# Patient Record
Sex: Female | Born: 1955
Health system: Southern US, Community
[De-identification: ages and names within clinical notes are randomized; demographics above are authoritative.]

## PROBLEM LIST (undated history)

## (undated) DIAGNOSIS — F419 Anxiety disorder, unspecified: Secondary | ICD-10-CM

## (undated) DIAGNOSIS — E785 Hyperlipidemia, unspecified: Secondary | ICD-10-CM

## (undated) DIAGNOSIS — R32 Unspecified urinary incontinence: Secondary | ICD-10-CM

## (undated) DIAGNOSIS — E162 Hypoglycemia, unspecified: Secondary | ICD-10-CM

## (undated) DIAGNOSIS — I10 Essential (primary) hypertension: Secondary | ICD-10-CM

## (undated) DIAGNOSIS — F329 Major depressive disorder, single episode, unspecified: Secondary | ICD-10-CM

## (undated) DIAGNOSIS — M109 Gout, unspecified: Secondary | ICD-10-CM

## (undated) DIAGNOSIS — G629 Polyneuropathy, unspecified: Secondary | ICD-10-CM

## (undated) DIAGNOSIS — K219 Gastro-esophageal reflux disease without esophagitis: Secondary | ICD-10-CM

## (undated) DIAGNOSIS — F32A Depression, unspecified: Secondary | ICD-10-CM

## (undated) DIAGNOSIS — E039 Hypothyroidism, unspecified: Secondary | ICD-10-CM

## (undated) DIAGNOSIS — N319 Neuromuscular dysfunction of bladder, unspecified: Secondary | ICD-10-CM

## (undated) HISTORY — DX: Anxiety disorder, unspecified: F41.9

## (undated) HISTORY — DX: Major depressive disorder, single episode, unspecified: F32.9

## (undated) HISTORY — DX: Gastro-esophageal reflux disease without esophagitis: K21.9

## (undated) HISTORY — DX: Gout, unspecified: M10.9

## (undated) HISTORY — PX: MULTIPLE TOOTH EXTRACTIONS: SHX2053

## (undated) HISTORY — DX: Hypothyroidism, unspecified: E03.9

## (undated) HISTORY — DX: Neuromuscular dysfunction of bladder, unspecified: N31.9

## (undated) HISTORY — DX: Depression, unspecified: F32.A

## (undated) HISTORY — DX: Hyperlipidemia, unspecified: E78.5

## (undated) HISTORY — DX: Hypoglycemia, unspecified: E16.2

## (undated) HISTORY — DX: Unspecified urinary incontinence: R32

## (undated) HISTORY — DX: Polyneuropathy, unspecified: G62.9

---

## 2015-05-17 DIAGNOSIS — S92355A Nondisplaced fracture of fifth metatarsal bone, left foot, initial encounter for closed fracture: Secondary | ICD-10-CM | POA: Diagnosis not present

## 2015-05-17 DIAGNOSIS — S93412A Sprain of calcaneofibular ligament of left ankle, initial encounter: Secondary | ICD-10-CM | POA: Diagnosis not present

## 2015-05-17 DIAGNOSIS — S8011XA Contusion of right lower leg, initial encounter: Secondary | ICD-10-CM | POA: Diagnosis not present

## 2015-06-07 DIAGNOSIS — S93412D Sprain of calcaneofibular ligament of left ankle, subsequent encounter: Secondary | ICD-10-CM | POA: Diagnosis not present

## 2015-06-07 DIAGNOSIS — S8011XD Contusion of right lower leg, subsequent encounter: Secondary | ICD-10-CM | POA: Diagnosis not present

## 2015-06-29 DIAGNOSIS — S92355A Nondisplaced fracture of fifth metatarsal bone, left foot, initial encounter for closed fracture: Secondary | ICD-10-CM | POA: Diagnosis not present

## 2015-06-29 DIAGNOSIS — M62579 Muscle wasting and atrophy, not elsewhere classified, unspecified ankle and foot: Secondary | ICD-10-CM | POA: Diagnosis not present

## 2015-06-29 DIAGNOSIS — M25672 Stiffness of left ankle, not elsewhere classified: Secondary | ICD-10-CM | POA: Diagnosis not present

## 2015-06-29 DIAGNOSIS — S93412A Sprain of calcaneofibular ligament of left ankle, initial encounter: Secondary | ICD-10-CM | POA: Diagnosis not present

## 2015-07-06 DIAGNOSIS — M62579 Muscle wasting and atrophy, not elsewhere classified, unspecified ankle and foot: Secondary | ICD-10-CM | POA: Diagnosis not present

## 2015-07-06 DIAGNOSIS — Z13820 Encounter for screening for osteoporosis: Secondary | ICD-10-CM | POA: Diagnosis not present

## 2015-07-06 DIAGNOSIS — Z0000 Encounter for general adult medical examination without abnormal findings: Secondary | ICD-10-CM | POA: Diagnosis not present

## 2015-07-06 DIAGNOSIS — M25672 Stiffness of left ankle, not elsewhere classified: Secondary | ICD-10-CM | POA: Diagnosis not present

## 2015-07-06 DIAGNOSIS — Z1211 Encounter for screening for malignant neoplasm of colon: Secondary | ICD-10-CM | POA: Diagnosis not present

## 2015-07-06 DIAGNOSIS — S93412D Sprain of calcaneofibular ligament of left ankle, subsequent encounter: Secondary | ICD-10-CM | POA: Diagnosis not present

## 2015-07-06 DIAGNOSIS — F329 Major depressive disorder, single episode, unspecified: Secondary | ICD-10-CM | POA: Diagnosis not present

## 2015-07-06 DIAGNOSIS — Z1231 Encounter for screening mammogram for malignant neoplasm of breast: Secondary | ICD-10-CM | POA: Diagnosis not present

## 2015-07-06 DIAGNOSIS — S92355D Nondisplaced fracture of fifth metatarsal bone, left foot, subsequent encounter for fracture with routine healing: Secondary | ICD-10-CM | POA: Diagnosis not present

## 2015-07-06 DIAGNOSIS — R42 Dizziness and giddiness: Secondary | ICD-10-CM | POA: Diagnosis not present

## 2015-07-13 DIAGNOSIS — S93412D Sprain of calcaneofibular ligament of left ankle, subsequent encounter: Secondary | ICD-10-CM | POA: Diagnosis not present

## 2015-07-13 DIAGNOSIS — S92355D Nondisplaced fracture of fifth metatarsal bone, left foot, subsequent encounter for fracture with routine healing: Secondary | ICD-10-CM | POA: Diagnosis not present

## 2015-07-13 DIAGNOSIS — M25672 Stiffness of left ankle, not elsewhere classified: Secondary | ICD-10-CM | POA: Diagnosis not present

## 2015-07-13 DIAGNOSIS — M62579 Muscle wasting and atrophy, not elsewhere classified, unspecified ankle and foot: Secondary | ICD-10-CM | POA: Diagnosis not present

## 2015-07-19 DIAGNOSIS — S93412D Sprain of calcaneofibular ligament of left ankle, subsequent encounter: Secondary | ICD-10-CM | POA: Diagnosis not present

## 2015-07-19 DIAGNOSIS — S8011XD Contusion of right lower leg, subsequent encounter: Secondary | ICD-10-CM | POA: Diagnosis not present

## 2015-07-19 DIAGNOSIS — S92355D Nondisplaced fracture of fifth metatarsal bone, left foot, subsequent encounter for fracture with routine healing: Secondary | ICD-10-CM | POA: Diagnosis not present

## 2015-10-12 DIAGNOSIS — E039 Hypothyroidism, unspecified: Secondary | ICD-10-CM | POA: Diagnosis not present

## 2015-10-12 DIAGNOSIS — L259 Unspecified contact dermatitis, unspecified cause: Secondary | ICD-10-CM | POA: Diagnosis not present

## 2015-10-12 DIAGNOSIS — F329 Major depressive disorder, single episode, unspecified: Secondary | ICD-10-CM | POA: Diagnosis not present

## 2015-10-12 DIAGNOSIS — E538 Deficiency of other specified B group vitamins: Secondary | ICD-10-CM | POA: Diagnosis not present

## 2015-10-12 DIAGNOSIS — M109 Gout, unspecified: Secondary | ICD-10-CM | POA: Diagnosis not present

## 2015-10-12 DIAGNOSIS — E785 Hyperlipidemia, unspecified: Secondary | ICD-10-CM | POA: Diagnosis not present

## 2015-10-25 DIAGNOSIS — E538 Deficiency of other specified B group vitamins: Secondary | ICD-10-CM | POA: Diagnosis not present

## 2015-10-25 DIAGNOSIS — E039 Hypothyroidism, unspecified: Secondary | ICD-10-CM | POA: Diagnosis not present

## 2015-11-02 DIAGNOSIS — W19XXXA Unspecified fall, initial encounter: Secondary | ICD-10-CM | POA: Diagnosis not present

## 2015-11-02 DIAGNOSIS — R209 Unspecified disturbances of skin sensation: Secondary | ICD-10-CM | POA: Diagnosis not present

## 2015-11-02 DIAGNOSIS — R42 Dizziness and giddiness: Secondary | ICD-10-CM | POA: Diagnosis not present

## 2015-11-02 DIAGNOSIS — E538 Deficiency of other specified B group vitamins: Secondary | ICD-10-CM | POA: Diagnosis not present

## 2015-11-02 DIAGNOSIS — R2681 Unsteadiness on feet: Secondary | ICD-10-CM | POA: Diagnosis not present

## 2015-11-02 DIAGNOSIS — F418 Other specified anxiety disorders: Secondary | ICD-10-CM | POA: Diagnosis not present

## 2015-11-02 DIAGNOSIS — F419 Anxiety disorder, unspecified: Secondary | ICD-10-CM | POA: Diagnosis not present

## 2015-12-07 DIAGNOSIS — E539 Vitamin B deficiency, unspecified: Secondary | ICD-10-CM | POA: Diagnosis not present

## 2015-12-07 DIAGNOSIS — E039 Hypothyroidism, unspecified: Secondary | ICD-10-CM | POA: Diagnosis not present

## 2016-04-08 DIAGNOSIS — Z23 Encounter for immunization: Secondary | ICD-10-CM | POA: Diagnosis not present

## 2016-04-17 DIAGNOSIS — M5431 Sciatica, right side: Secondary | ICD-10-CM | POA: Diagnosis not present

## 2016-04-17 DIAGNOSIS — E039 Hypothyroidism, unspecified: Secondary | ICD-10-CM | POA: Diagnosis not present

## 2016-04-17 DIAGNOSIS — E539 Vitamin B deficiency, unspecified: Secondary | ICD-10-CM | POA: Diagnosis not present

## 2016-04-17 DIAGNOSIS — Z1231 Encounter for screening mammogram for malignant neoplasm of breast: Secondary | ICD-10-CM | POA: Diagnosis not present

## 2016-04-17 DIAGNOSIS — Z13820 Encounter for screening for osteoporosis: Secondary | ICD-10-CM | POA: Diagnosis not present

## 2016-04-25 DIAGNOSIS — Z1231 Encounter for screening mammogram for malignant neoplasm of breast: Secondary | ICD-10-CM | POA: Diagnosis not present

## 2016-04-25 DIAGNOSIS — Z01419 Encounter for gynecological examination (general) (routine) without abnormal findings: Secondary | ICD-10-CM | POA: Diagnosis not present

## 2016-04-25 DIAGNOSIS — Z124 Encounter for screening for malignant neoplasm of cervix: Secondary | ICD-10-CM | POA: Diagnosis not present

## 2016-07-17 ENCOUNTER — Encounter: Payer: Self-pay | Admitting: Neurology

## 2016-08-16 ENCOUNTER — Ambulatory Visit: Payer: Self-pay | Admitting: Neurology

## 2016-09-19 ENCOUNTER — Encounter: Payer: Self-pay | Admitting: Neurology

## 2016-09-19 ENCOUNTER — Ambulatory Visit (INDEPENDENT_AMBULATORY_CARE_PROVIDER_SITE_OTHER): Payer: Medicare Other | Admitting: Neurology

## 2016-09-19 VITALS — BP 126/78 | HR 80 | Ht 61.0 in | Wt 210.0 lb

## 2016-09-19 DIAGNOSIS — N319 Neuromuscular dysfunction of bladder, unspecified: Secondary | ICD-10-CM

## 2016-09-19 DIAGNOSIS — F339 Major depressive disorder, recurrent, unspecified: Secondary | ICD-10-CM | POA: Insufficient documentation

## 2016-09-19 DIAGNOSIS — G609 Hereditary and idiopathic neuropathy, unspecified: Secondary | ICD-10-CM | POA: Diagnosis not present

## 2016-09-19 DIAGNOSIS — F329 Major depressive disorder, single episode, unspecified: Secondary | ICD-10-CM

## 2016-09-19 DIAGNOSIS — F32A Depression, unspecified: Secondary | ICD-10-CM

## 2016-09-19 DIAGNOSIS — F419 Anxiety disorder, unspecified: Secondary | ICD-10-CM | POA: Insufficient documentation

## 2016-09-19 MED ORDER — LYRICA 225 MG PO CAPS: 225.0000 mg | ORAL_CAPSULE | Freq: Two times a day (BID) | ORAL | 3 refills | Status: DC

## 2016-09-19 MED ORDER — POTASSIUM CHLORIDE ER 10 MEQ PO TBCR: EXTENDED_RELEASE_TABLET | ORAL | 0 refills | Status: DC

## 2016-09-19 MED ORDER — SERTRALINE HCL 100 MG PO TABS: 100.0000 mg | ORAL_TABLET | Freq: Every day | ORAL | 0 refills | Status: DC

## 2016-09-19 MED ORDER — TOLTERODINE TARTRATE 2 MG PO TABS: 2.0000 mg | ORAL_TABLET | Freq: Two times a day (BID) | ORAL | 0 refills | Status: DC

## 2016-09-19 NOTE — Progress Notes (Signed)
NEUROLOGY CONSULTATION NOTE  Samantha Burch MRN: [id number] DOB: 1956/04/16  Referring provider: Kathlynn Grate, PA Primary care provider: none listed  Reason for consult:  Peripheral polyneuropathy  Thank you for your kind referral of Samantha Burch for consultation of the above symptoms. Although her history is well known to you, please allow me to reiterate it for the purpose of our medical record. Records and images were personally reviewed where available.  HISTORY OF PRESENT ILLNESS: This is a very pleasant 61 year old right-handed woman with a history of hypothyroidism, neurogenic bladder, depression, anxiety, presenting to establish care for peripheral polyneuropathy. She recently moved to [place] from [place] with her son. She had been seeing neurologist Dr. [name] for the neuropathy. 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 Symptoms worsened over time with significant pain in both legs described as pain inside her legs like she wanted to scratch them all the time. She described pins and needles sensation, sometimes affecting her arms as well. She was initially started on gabapentin but it stopped working, and has been taking Lyrica for the past 10 years. She is taking Lyrica 225mg  BID without side effects. She reports symptom control is much better but not perfect, there are days the neuropathy bothers her, more when she is inactive. She has found that moving around helps relieve the symptoms. She fell last year and broke her left foot, and since then has tended to veer when she gets up. She denies any back pain, but reports pain in the right hip region radiating down the back of her right leg. She tells me her neurologist told her she has unusually brisk reflexes for the neuropathy, per records she has had an MRI brain, cervical, thoracic spine that were unremarkable. NCS reported as normal. Her  B12 level was low and she has been on monthly B12 injections. She has a history of vertigo that significantly improved with vestibular therapy, she was told she has gaze instability, and has intermittent vertigo with positional changes. She denies any headaches, tinnitus, diplopia, dysarthria/dysphagia, neck pain, bowel/bladder dysfunction. She has a history of neurogenic bladder on Detrol, low potassium on daily potassium supplements, and depression/anxiety on Zoloft. She reports a family history of peripheral polyneuropathy in her father and paternal grandfather.  PAST MEDICAL HISTORY:  Active Ambulatory Problems    Diagnosis Date Noted  . No Active Ambulatory Problems   Resolved Ambulatory Problems    Diagnosis Date Noted  . No Resolved Ambulatory Problems   Past Medical History:  Diagnosis Date  . Anxiety   . Depression   . Hypothyroidism   . Neurogenic bladder   . Neuropathy    PAST SURGICAL HISTORY: No past surgical history on file.  MEDICATIONS:  Outpatient Encounter Prescriptions as of 09/19/2016  Medication Sig  . busPIRone (BUSPAR) 15 MG tablet Take 15 mg by mouth 2 (two) times daily.  . cyanocobalamin (,VITAMIN B-12,) 1000 MCG/ML injection INJECT 1 ML SUBCUTANEOUSLY ONCE WEEKLY FOR 4 WEEKS THEN ONCE MONTHLY  . levothyroxine (SYNTHROID, LEVOTHROID) 88 MCG tablet Take 88 mcg by mouth daily.  Marland Kitchen LYRICA 225 MG capsule Take 225 mg by mouth 2 (two) times daily.  Marland Kitchen omeprazole (PRILOSEC) 20 MG capsule Take 20 mg by mouth 2 (two) times daily before a meal.  . sertraline (ZOLOFT) 100 MG tablet Take 100 mg by mouth daily. Take 1.5 tabs daily  . tolterodine (DETROL)  2 MG tablet Take 2 mg by mouth 2 (two) times daily.   No facility-administered encounter medications on file as of 09/19/2016.    Outpatient Encounter Prescriptions as of 09/19/2016  Medication Sig  . busPIRone (BUSPAR) 15 MG tablet Take 15 mg by mouth 2 (two) times daily.  . cyanocobalamin (,VITAMIN B-12,) 1000 MCG/ML  injection INJECT 1 ML SUBCUTANEOUSLY ONCE WEEKLY FOR 4 WEEKS THEN ONCE MONTHLY  . levothyroxine (SYNTHROID, LEVOTHROID) 88 MCG tablet Take 88 mcg by mouth daily.  Marland Kitchen LYRICA 225 MG capsule Take 1 capsule (225 mg total) by mouth 2 (two) times daily.  Marland Kitchen omeprazole (PRILOSEC) 20 MG capsule Take 20 mg by mouth 2 (two) times daily before a meal.  . sertraline (ZOLOFT) 100 MG tablet Take 1 tablet (100 mg total) by mouth daily. Take 1.5 tabs daily  . tolterodine (DETROL) 2 MG tablet Take 1 tablet (2 mg total) by mouth 2 (two) times daily.  . [DISCONTINUED] LYRICA 225 MG capsule Take 225 mg by mouth 2 (two) times daily.  . [DISCONTINUED] sertraline (ZOLOFT) 100 MG tablet Take 100 mg by mouth daily. Take 1.5 tabs daily  . [DISCONTINUED] tolterodine (DETROL) 2 MG tablet Take 2 mg by mouth 2 (two) times daily.  . potassium chloride (K-DUR) 10 MEQ tablet Take 1 tablet daily   No facility-administered encounter medications on file as of 09/19/2016.    ALLERGIES: No Known Allergies  FAMILY HISTORY: No family history on file.  SOCIAL HISTORY: Social History   Social History  . Marital status: Unknown    Spouse name: N/A  . Number of children: N/A  . Years of education: N/A   Occupational History  . Not on file.   Social History Main Topics  . Smoking status: Former Research scientist (life sciences)  . Smokeless tobacco: Never Used  . Alcohol use No  . Drug use: No  . Sexual activity: Not on file   Other Topics Concern  . Not on file   Social History Narrative  . No narrative on file    REVIEW OF SYSTEMS: Constitutional: No fevers, chills, or sweats, no generalized fatigue, change in appetite Eyes: No visual changes, double vision, eye pain Ear, nose and throat: No hearing loss, ear pain, nasal congestion, sore throat Cardiovascular: No chest pain, palpitations Respiratory:  No shortness of breath at rest or with exertion, wheezes GastrointestinaI: No nausea, vomiting, diarrhea, abdominal pain, fecal  incontinence Genitourinary:  No dysuria, urinary retention or frequency Musculoskeletal:  No neck pain, back pain Integumentary: No rash, pruritus, skin lesions Neurological: as above Psychiatric: + depression, insomnia, anxiety Endocrine: No palpitations, fatigue, diaphoresis, mood swings, change in appetite, change in weight, increased thirst Hematologic/Lymphatic:  No anemia, purpura, petechiae. Allergic/Immunologic: no itchy/runny eyes, nasal congestion, recent allergic reactions, rashes  PHYSICAL EXAM: Vitals:   09/19/16 1410  BP: 126/78  Pulse: 80   General: No acute distress Head:  Normocephalic/atraumatic Eyes: Fundoscopic exam shows bilateral sharp discs, no vessel changes, exudates, or hemorrhages Neck: supple, no paraspinal tenderness, full range of motion Back: No paraspinal tenderness Heart: regular rate and rhythm Lungs: Clear to auscultation bilaterally. Vascular: No carotid bruits. Skin/Extremities: No rash, no edema Neurological Exam: Mental status: alert and oriented to person, place, and time, no dysarthria or aphasia, Fund of knowledge is appropriate.  Recent and remote memory are intact.  Attention and concentration are normal.    Able to name objects and repeat phrases. Cranial nerves: CN I: not tested CN II: pupils equal, round and reactive to  light, visual fields intact, fundi unremarkable. CN III, IV, VI:  full range of motion, no nystagmus, no ptosis CN V: facial sensation intact CN VII: upper and lower face symmetric CN VIII: hearing intact to finger rub CN IX, X: gag intact, uvula midline CN XI: sternocleidomastoid and trapezius muscles intact CN XII: tongue midline Bulk & Tone: normal, no fasciculations. Motor: 5/5 throughout with no pronator drift. Sensation: reports increased cold on both hands, decreased cold on both feet, decreased vibration to right ankle, intact pin on both UE and LE, intact joint position sense.  No extinction to double  simultaneous stimulation.  Romberg test negative Deep Tendon Reflexes: brisk +3 throughout, with +bilateral pectoralis reflex and Hoffman sign, no ankle clonus Plantar responses: downgoing bilaterally Cerebellar: no incoordination on finger to nose, heel to shin. No dysdiadochokinesia Gait: narrow-based and steady, mild difficulty with tandem walk but able Tremor: none  IMPRESSION: This is a very pleasant 61 year old right-handed woman with a history of hypothyroidism, neurogenic bladder, depression, anxiety, presenting to establish care for peripheral polyneuropathy. Per notes, she has an unremarkable MRI brain, cervical, and thoracic spine, and normal nerve conduction studies. Reports unavailable for review and will be requested. Her exam shows mild length-dependent neuropathy with abnormally brisk reflexes, including +pectoralis reflex and Hoffman sign bilaterally. We discussed continuation of current dose of Lyrica 225mg  BID, which she agrees with. She has not yet established care with a local PCP and will be given a 1 month supply of Zoloft, potassium, and Detrol until she establishes PCP care. She will follow-up in 1 year and knows to call for any changes.   Thank you for allowing me to participate in the care of this patient. Please do not hesitate to call for any questions or concerns.   Ellouise Newer, M.D.  CC: Kathlynn Grate, [place]

## 2016-09-19 NOTE — Patient Instructions (Signed)
1. Continue all your medications as prescribed 2. Let us know about the B12 injections 3. Follow-up in 1 year, call for any changes

## 2016-09-20 ENCOUNTER — Ambulatory Visit: Payer: Self-pay | Admitting: Neurology

## 2016-10-20 ENCOUNTER — Other Ambulatory Visit: Payer: Self-pay | Admitting: Neurology

## 2016-10-20 DIAGNOSIS — N319 Neuromuscular dysfunction of bladder, unspecified: Secondary | ICD-10-CM

## 2016-10-20 DIAGNOSIS — F329 Major depressive disorder, single episode, unspecified: Secondary | ICD-10-CM

## 2016-10-20 DIAGNOSIS — F32A Depression, unspecified: Secondary | ICD-10-CM

## 2016-10-20 DIAGNOSIS — F419 Anxiety disorder, unspecified: Secondary | ICD-10-CM

## 2016-10-20 DIAGNOSIS — G609 Hereditary and idiopathic neuropathy, unspecified: Secondary | ICD-10-CM

## 2016-11-10 ENCOUNTER — Other Ambulatory Visit: Payer: Self-pay | Admitting: Neurology

## 2016-11-10 DIAGNOSIS — F419 Anxiety disorder, unspecified: Secondary | ICD-10-CM

## 2016-11-10 DIAGNOSIS — G609 Hereditary and idiopathic neuropathy, unspecified: Secondary | ICD-10-CM

## 2016-11-10 DIAGNOSIS — F329 Major depressive disorder, single episode, unspecified: Secondary | ICD-10-CM

## 2016-11-10 DIAGNOSIS — F32A Depression, unspecified: Secondary | ICD-10-CM

## 2016-12-11 ENCOUNTER — Other Ambulatory Visit: Payer: Self-pay | Admitting: Neurology

## 2016-12-11 DIAGNOSIS — F419 Anxiety disorder, unspecified: Secondary | ICD-10-CM

## 2016-12-11 DIAGNOSIS — F329 Major depressive disorder, single episode, unspecified: Secondary | ICD-10-CM

## 2016-12-11 DIAGNOSIS — F32A Depression, unspecified: Secondary | ICD-10-CM

## 2016-12-11 DIAGNOSIS — G609 Hereditary and idiopathic neuropathy, unspecified: Secondary | ICD-10-CM

## 2017-01-13 ENCOUNTER — Encounter: Payer: Self-pay | Admitting: Family Medicine

## 2017-01-13 ENCOUNTER — Ambulatory Visit (INDEPENDENT_AMBULATORY_CARE_PROVIDER_SITE_OTHER): Payer: Medicare Other | Admitting: Family Medicine

## 2017-01-13 VITALS — BP 130/86 | HR 77 | Temp 98.1°F | Ht 60.5 in | Wt 197.3 lb

## 2017-01-13 DIAGNOSIS — E039 Hypothyroidism, unspecified: Secondary | ICD-10-CM | POA: Diagnosis not present

## 2017-01-13 DIAGNOSIS — Z7689 Persons encountering health services in other specified circumstances: Secondary | ICD-10-CM | POA: Diagnosis not present

## 2017-01-13 DIAGNOSIS — E538 Deficiency of other specified B group vitamins: Secondary | ICD-10-CM

## 2017-01-13 DIAGNOSIS — Z8639 Personal history of other endocrine, nutritional and metabolic disease: Secondary | ICD-10-CM

## 2017-01-13 DIAGNOSIS — F339 Major depressive disorder, recurrent, unspecified: Secondary | ICD-10-CM | POA: Diagnosis not present

## 2017-01-13 DIAGNOSIS — F329 Major depressive disorder, single episode, unspecified: Secondary | ICD-10-CM

## 2017-01-13 DIAGNOSIS — R42 Dizziness and giddiness: Secondary | ICD-10-CM | POA: Diagnosis not present

## 2017-01-13 DIAGNOSIS — K219 Gastro-esophageal reflux disease without esophagitis: Secondary | ICD-10-CM | POA: Diagnosis not present

## 2017-01-13 DIAGNOSIS — Z23 Encounter for immunization: Secondary | ICD-10-CM | POA: Diagnosis not present

## 2017-01-13 DIAGNOSIS — J3089 Other allergic rhinitis: Secondary | ICD-10-CM

## 2017-01-13 DIAGNOSIS — G609 Hereditary and idiopathic neuropathy, unspecified: Secondary | ICD-10-CM | POA: Diagnosis not present

## 2017-01-13 DIAGNOSIS — N319 Neuromuscular dysfunction of bladder, unspecified: Secondary | ICD-10-CM | POA: Diagnosis not present

## 2017-01-13 DIAGNOSIS — F419 Anxiety disorder, unspecified: Secondary | ICD-10-CM

## 2017-01-13 LAB — CBC WITH DIFFERENTIAL/PLATELET
BASOS ABS: 0 10*3/uL (ref 0.0–0.1)
Basophils Relative: 0.6 % (ref 0.0–3.0)
EOS ABS: 0.1 10*3/uL (ref 0.0–0.7)
Eosinophils Relative: 1.7 % (ref 0.0–5.0)
HEMATOCRIT: 41.4 % (ref 36.0–46.0)
Hemoglobin: 13.9 g/dL (ref 12.0–15.0)
LYMPHS ABS: 3.9 10*3/uL (ref 0.7–4.0)
LYMPHS PCT: 48.1 % — AB (ref 12.0–46.0)
MCHC: 33.6 g/dL (ref 30.0–36.0)
MCV: 86.8 fl (ref 78.0–100.0)
Monocytes Absolute: 0.7 10*3/uL (ref 0.1–1.0)
Monocytes Relative: 8.9 % (ref 3.0–12.0)
NEUTROS ABS: 3.3 10*3/uL (ref 1.4–7.7)
NEUTROS PCT: 40.7 % — AB (ref 43.0–77.0)
PLATELETS: 231 10*3/uL (ref 150.0–400.0)
RBC: 4.77 Mil/uL (ref 3.87–5.11)
RDW: 13.4 % (ref 11.5–15.5)
WBC: 8.1 10*3/uL (ref 4.0–10.5)

## 2017-01-13 LAB — COMPREHENSIVE METABOLIC PANEL
ALBUMIN: 4.3 g/dL (ref 3.5–5.2)
ALK PHOS: 86 U/L (ref 39–117)
ALT: 27 U/L (ref 0–35)
AST: 28 U/L (ref 0–37)
BUN: 9 mg/dL (ref 6–23)
CALCIUM: 9.7 mg/dL (ref 8.4–10.5)
CHLORIDE: 100 meq/L (ref 96–112)
CO2: 29 mEq/L (ref 19–32)
CREATININE: 0.85 mg/dL (ref 0.40–1.20)
GFR: 72.37 mL/min (ref >60.00)
Glucose, Bld: 87 mg/dL (ref 70–99)
POTASSIUM: 3.5 meq/L (ref 3.5–5.1)
Sodium: 141 mEq/L (ref 135–145)
Total Bilirubin: 0.5 mg/dL (ref 0.2–1.2)
Total Protein: 6.9 g/dL (ref 6.0–8.3)

## 2017-01-13 LAB — FOLATE: FOLATE: 15.3 ng/mL (ref >5.9)

## 2017-01-13 LAB — TSH: TSH: 2.6 u[IU]/mL (ref 0.35–4.50)

## 2017-01-13 MED ORDER — LEVOTHYROXINE SODIUM 88 MCG PO TABS: 88.0000 ug | ORAL_TABLET | Freq: Every day | ORAL | 1 refills | Status: DC

## 2017-01-13 MED ORDER — OMEPRAZOLE 20 MG PO CPDR: 20.0000 mg | DELAYED_RELEASE_CAPSULE | Freq: Two times a day (BID) | ORAL | 0 refills | Status: DC

## 2017-01-13 MED ORDER — TOLTERODINE TARTRATE 2 MG PO TABS: 2.0000 mg | ORAL_TABLET | Freq: Two times a day (BID) | ORAL | 0 refills | Status: DC

## 2017-01-13 MED ORDER — FEXOFENADINE HCL 180 MG PO TABS: 180.0000 mg | ORAL_TABLET | Freq: Every day | ORAL | 1 refills | Status: DC

## 2017-01-13 MED ORDER — SERTRALINE HCL 100 MG PO TABS: 150.0000 mg | ORAL_TABLET | Freq: Every day | ORAL | 1 refills | Status: DC

## 2017-01-13 MED ORDER — POTASSIUM CHLORIDE ER 10 MEQ PO CPCR: 10.0000 meq | ORAL_CAPSULE | Freq: Every day | ORAL | 1 refills | Status: DC

## 2017-01-13 MED ORDER — BUSPIRONE HCL 15 MG PO TABS: 15.0000 mg | ORAL_TABLET | Freq: Two times a day (BID) | ORAL | 1 refills | Status: DC
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
## 2017-01-13 NOTE — Patient Instructions (Addendum)
We have ordered labs at this visit. It can take up to 1-2 weeks for results and processing. If results require follow up or explanation, we will call you with instructions. Clinically stable results will be released to your [facility] or sent to you via mail. If you have not heard from [place] or cannot find your results in [facility] in 2 weeks please contact our office at [phone].   At today's visit you signed a records release form so that we can get your records from your previous physician.   You have several medical problems in which you would benefit from seeing a physician who specializes in that particular area.  I know that cost is an issue but seeing a Gastroenterologist for your continued acid reflux symptoms would probably be a good idea. Generalized Anxiety Disorder, Adult Generalized anxiety disorder (GAD) is a mental health disorder. People with this condition constantly worry about everyday events. Unlike normal anxiety, worry related to GAD is not triggered by a specific event. These worries also do not fade or get better with time. GAD interferes with life functions, including relationships, work, and school. GAD can vary from mild to severe. People with severe GAD can have intense waves of anxiety with physical symptoms (panic attacks). What are the causes? The exact cause of GAD is not known. What increases the risk? This condition is more likely to develop in:  Women.  People who have a family history of anxiety disorders.  People who are very shy.  People who experience very stressful life events, such as the death of a loved one.  People who have a very stressful family environment.  What are the signs or symptoms? People with GAD often worry excessively about many things in their lives, such as their health and family. They may also be overly concerned about:  Doing well at work.  Being on time.  Natural disasters.  Friendships.  Physical symptoms of GAD  include:  Fatigue.  Muscle tension or having muscle twitches.  Trembling or feeling shaky.  Being easily startled.  Feeling like your heart is pounding or racing.  Feeling out of breath or like you cannot take a deep breath.  Having trouble falling asleep or staying asleep.  Sweating.  Nausea, diarrhea, or irritable bowel syndrome (IBS).  Headaches.  Trouble concentrating or remembering facts.  Restlessness.  Irritability.  How is this diagnosed? Your health care provider can diagnose GAD based on your symptoms and medical history. You will also have a physical exam. The health care provider will ask specific questions about your symptoms, including how severe they are, when they started, and if they come and go. Your health care provider may ask you about your use of alcohol or drugs, including prescription medicines. Your health care provider may refer you to a mental health specialist for further evaluation. Your health care provider will do a thorough examination and may perform additional tests to rule out other possible causes of your symptoms. To be diagnosed with GAD, a person must have anxiety that:  Is out of his or her control.  Affects several different aspects of his or her life, such as work and relationships.  Causes distress that makes him or her unable to take part in normal activities.  Includes at least three physical symptoms of GAD, such as restlessness, fatigue, trouble concentrating, irritability, muscle tension, or sleep problems.  Before your health care provider can confirm a diagnosis of GAD, these symptoms must be present more  days than they are not, and they must last for six months or longer. How is this treated? The following therapies are usually used to treat GAD:  Medicine. Antidepressant medicine is usually prescribed for long-term daily control. Antianxiety medicines may be added in severe cases, especially when panic attacks  occur.  Talk therapy (psychotherapy). Certain types of talk therapy can be helpful in treating GAD by providing support, education, and guidance. Options include: ? Cognitive behavioral therapy (CBT). People learn coping skills and techniques to ease their anxiety. They learn to identify unrealistic or negative thoughts and behaviors and to replace them with positive ones. ? Acceptance and commitment therapy (ACT). This treatment teaches people how to be mindful as a way to cope with unwanted thoughts and feelings. ? Biofeedback. This process trains you to manage your body's response (physiological response) through breathing techniques and relaxation methods. You will work with a therapist while machines are used to monitor your physical symptoms.  Stress management techniques. These include yoga, meditation, and exercise.  A mental health specialist can help determine which treatment is best for you. Some people see improvement with one type of therapy. However, other people require a combination of therapies. Follow these instructions at home:  Take over-the-counter and prescription medicines only as told by your health care provider.  Try to maintain a normal routine.  Try to anticipate stressful situations and allow extra time to manage them.  Practice any stress management or self-calming techniques as taught by your health care provider.  Do not punish yourself for setbacks or for not making progress.  Try to recognize your accomplishments, even if they are small.  Keep all follow-up visits as told by your health care provider. This is important. Contact a health care provider if:  Your symptoms do not get better.  Your symptoms get worse.  You have signs of depression, such as: ? A persistently sad, cranky, or irritable mood. ? Loss of enjoyment in activities that used to bring you joy. ? Change in weight or eating. ? Changes in sleeping habits. ? Avoiding friends or  family members. ? Loss of energy for normal tasks. ? Feelings of guilt or worthlessness. Get help right away if:  You have serious thoughts about hurting yourself or others. If you ever feel like you may hurt yourself or others, or have thoughts about taking your own life, get help right away. You can go to your nearest emergency department or call:  Your local emergency services (911 in the [place]  A suicide crisis helpline, such as the [facility] at (856)613-7481. This is open 24 hours a day.  Summary  Generalized anxiety disorder (GAD) is a mental health disorder that involves worry that is not triggered by a specific event.  People with GAD often worry excessively about many things in their lives, such as their health and family.  GAD may cause physical symptoms such as restlessness, trouble concentrating, sleep problems, frequent sweating, nausea, diarrhea, headaches, and trembling or muscle twitching.  A mental health specialist can help determine which treatment is best for you. Some people see improvement with one type of therapy. However, other people require a combination of therapies. This information is not intended to replace advice given to you by your health care provider. Make sure you discuss any questions you have with your health care provider. Document Released: 08/17/2012 Document Revised: 03/12/2016 Document Reviewed: 03/12/2016 Elsevier Interactive Patient Education  2018 [place] After  being diagnosed with an anxiety disorder, you may be relieved to know why you have felt or behaved a certain way. It is natural to also feel overwhelmed about the treatment ahead and what it will mean for your life. With care and support, you can manage this condition and recover from it. How to cope with anxiety Dealing with stress Stress is your body's reaction to life changes and events, both good and bad. Stress can last just  a few hours or it can be ongoing. Stress can play a major role in anxiety, so it is important to learn both how to cope with stress and how to think about it differently. Talk with your health care provider or a counselor to learn more about stress reduction. He or she may suggest some stress reduction techniques, such as:  Music therapy. This can include creating or listening to music that you enjoy and that inspires you.  Mindfulness-based meditation. This involves being aware of your normal breaths, rather than trying to control your breathing. It can be done while sitting or walking.  Centering prayer. This is a kind of meditation that involves focusing on a word, phrase, or sacred image that is meaningful to you and that brings you peace.  Deep breathing. To do this, expand your stomach and inhale slowly through your nose. Hold your breath for 3-5 seconds. Then exhale slowly, allowing your stomach muscles to relax.  Self-talk. This is a skill where you identify thought patterns that lead to anxiety reactions and correct those thoughts.  Muscle relaxation. This involves tensing muscles then relaxing them.  Choose a stress reduction technique that fits your lifestyle and personality. Stress reduction techniques take time and practice. Set aside 5-15 minutes a day to do them. Therapists can offer training in these techniques. The training may be covered by some insurance plans. Other things you can do to manage stress include:  Keeping a stress diary. This can help you learn what triggers your stress and ways to control your response.  Thinking about how you respond to certain situations. You may not be able to control everything, but you can control your reaction.  Making time for activities that help you relax, and not feeling guilty about spending your time in this way.  Therapy combined with coping and stress-reduction skills provides the best chance for successful  treatment. Medicines Medicines can help ease symptoms. Medicines for anxiety include:  Anti-anxiety drugs.  Antidepressants.  Beta-blockers.  Medicines may be used as the main treatment for anxiety disorder, along with therapy, or if other treatments are not working. Medicines should be prescribed by a health care provider. Relationships Relationships can play a big part in helping you recover. Try to spend more time connecting with trusted friends and family members. Consider going to couples counseling, taking family education classes, or going to family therapy. Therapy can help you and others better understand the condition. How to recognize changes in your condition Everyone has a different response to treatment for anxiety. Recovery from anxiety happens when symptoms decrease and stop interfering with your daily activities at home or work. This may mean that you will start to:  Have better concentration and focus.  Sleep better.  Be less irritable.  Have more energy.  Have improved memory.  It is important to recognize when your condition is getting worse. Contact your health care provider if your symptoms interfere with home or work and you do not feel like your condition is improving. Where  to find help and support: You can get help and support from these sources:  Self-help groups.  Online and [employer].  A trusted spiritual leader.  Couples counseling.  Family education classes.  Family therapy.  Follow these instructions at home:  Eat a healthy diet that includes plenty of vegetables, fruits, whole grains, low-fat dairy products, and lean protein. Do not eat a lot of foods that are high in solid fats, added sugars, or salt.  Exercise. Most adults should do the following: ? Exercise for at least 150 minutes each week. The exercise should increase your heart rate and make you sweat (moderate-intensity exercise). ? Strengthening exercises at least  twice a week.  Cut down on caffeine, tobacco, alcohol, and other potentially harmful substances.  Get the right amount and quality of sleep. Most adults need 7-9 hours of sleep each night.  Make choices that simplify your life.  Take over-the-counter and prescription medicines only as told by your health care provider.  Avoid caffeine, alcohol, and certain over-the-counter cold medicines. These may make you feel worse. Ask your pharmacist which medicines to avoid.  Keep all follow-up visits as told by your health care provider. This is important. Questions to ask your health care provider  Would I benefit from therapy?  How often should I follow up with a health care provider?  How long do I need to take medicine?  Are there any long-term side effects of my medicine?  Are there any alternatives to taking medicine? Contact a health care provider if:  You have a hard time staying focused or finishing daily tasks.  You spend many hours a day feeling worried about everyday life.  You become exhausted by worry.  You start to have headaches, feel tense, or have nausea.  You urinate more than normal.  You have diarrhea. Get help right away if:  You have a racing heart and shortness of breath.  You have thoughts of hurting yourself or others. If you ever feel like you may hurt yourself or others, or have thoughts about taking your own life, get help right away. You can go to your nearest emergency department or call:  Your local emergency services (911 in the [place]  A suicide crisis helpline, such as the [place] at (403) 810-3566. This is open 24-hours a day.  Summary  Taking steps to deal with stress can help calm you.  Medicines cannot cure anxiety disorders, but they can help ease symptoms.  Family, friends, and partners can play a big part in helping you recover from an anxiety disorder. This information is not intended to replace  advice given to you by your health care provider. Make sure you discuss any questions you have with your health care provider. Document Released: 04/16/2016 Document Revised: 04/16/2016 Document Reviewed: 04/16/2016 Elsevier Interactive Patient Education  2018 [place] With Depression Everyone experiences occasional disappointment, sadness, and loss in their lives. When you are feeling down, blue, or sad for at least 2 weeks in a row, it may mean that you have depression. Depression can affect your thoughts and feelings, relationships, daily activities, and physical health. It is caused by changes in the way your brain functions. If you receive a diagnosis of depression, your health care provider will tell you which type of depression you have and what treatment options are available to you. If you are living with depression, there are ways to help you recover from it and also ways to prevent  it from coming back. How to cope with lifestyle changes Coping with stress Stress is your body's reaction to life changes and events, both good and bad. Stressful situations may include:  Getting married.  The death of a spouse.  Losing a job.  Retiring.  Having a baby.  Stress can last just a few hours or it can be ongoing. Stress can play a major role in depression, so it is important to learn both how to cope with stress and how to think about it differently. Talk with your health care provider or a counselor if you would like to learn more about stress reduction. He or she may suggest some stress reduction techniques, such as:  Music therapy. This can include creating music or listening to music. Choose music that you enjoy and that inspires you.  Mindfulness-based meditation. This kind of meditation can be done while sitting or walking. It involves being aware of your normal breaths, rather than trying to control your breathing.  Centering prayer. This is a kind of meditation that  involves focusing on a spiritual word or phrase. Choose a word, phrase, or sacred image that is meaningful to you and that brings you peace.  Deep breathing. To do this, expand your stomach and inhale slowly through your nose. Hold your breath for 3-5 seconds, then exhale slowly, allowing your stomach muscles to relax.  Muscle relaxation. This involves intentionally tensing muscles then relaxing them.  Choose a stress reduction technique that fits your lifestyle and personality. Stress reduction techniques take time and practice to develop. Set aside 5-15 minutes a day to do them. Therapists can offer training in these techniques. The training may be covered by some insurance plans. Other things you can do to manage stress include:  Keeping a stress diary. This can help you learn what triggers your stress and ways to control your response.  Understanding what your limits are and saying no to requests or events that lead to a schedule that is too full.  Thinking about how you respond to certain situations. You may not be able to control everything, but you can control how you react.  Adding humor to your life by watching funny films or TV shows.  Making time for activities that help you relax and not feeling guilty about spending your time this way.  Medicines Your health care provider may suggest certain medicines if he or she feels that they will help improve your condition. Avoid using alcohol and other substances that may prevent your medicines from working properly (may interact). It is also important to:  Talk with your pharmacist or health care provider about all the medicines that you take, their possible side effects, and what medicines are safe to take together.  Make it your goal to take part in all treatment decisions (shared decision-making). This includes giving input on the side effects of medicines. It is best if shared decision-making with your health care provider is part of  your total treatment plan.  If your health care provider prescribes a medicine, you may not notice the full benefits of it for 4-8 weeks. Most people who are treated for depression need to be on medicine for at least 6-12 months after they feel better. If you are taking medicines as part of your treatment, do not stop taking medicines without first talking to your health care provider. You may need to have the medicine slowly decreased (tapered) over time to decrease the risk of harmful side effects. Relationships  Your health care provider may suggest family therapy along with individual therapy and drug therapy. While there may not be family problems that are causing you to feel depressed, it is still important to make sure your family learns as much as they can about your mental health. Having your family's support can help make your treatment successful. How to recognize changes in your condition Everyone has a different response to treatment for depression. Recovery from major depression happens when you have not had signs of major depression for two months. This may mean that you will start to:  Have more interest in doing activities.  Feel less hopeless than you did 2 months ago.  Have more energy.  Overeat less often, or have better or improving appetite.  Have better concentration.  Your health care provider will work with you to decide the next steps in your recovery. It is also important to recognize when your condition is getting worse. Watch for these signs:  Having fatigue or low energy.  Eating too much or too little.  Sleeping too much or too little.  Feeling restless, agitated, or hopeless.  Having trouble concentrating or making decisions.  Having unexplained physical complaints.  Feeling irritable, angry, or aggressive.  Get help as soon as you or your family members notice these symptoms coming back. How to get support and help from others How to talk with  friends and family members about your condition Talking to friends and family members about your condition can provide you with one way to get support and guidance. Reach out to trusted friends or family members, explain your symptoms to them, and let them know that you are working with a health care provider to treat your depression. Financial resources Not all insurance plans cover mental health care, so it is important to check with your insurance carrier. If paying for co-pays or counseling services is a problem, search for a local or county mental health care center. They may be able to offer public mental health care services at low or no cost when you are not able to see a private health care provider. If you are taking medicine for depression, you may be able to get the generic form, which may be less expensive. Some makers of prescription medicines also offer help to patients who cannot afford the medicines they need. Follow these instructions at home:  Get the right amount and quality of sleep.  Cut down on using caffeine, tobacco, alcohol, and other potentially harmful substances.  Try to exercise, such as walking or lifting small weights.  Take over-the-counter and prescription medicines only as told by your health care provider.  Eat a healthy diet that includes plenty of vegetables, fruits, whole grains, low-fat dairy products, and lean protein. Do not eat a lot of foods that are high in solid fats, added sugars, or salt.  Keep all follow-up visits as told by your health care provider. This is important. Contact a health care provider if:  You stop taking your antidepressant medicines, and you have any of these symptoms: ? Nausea. ? Headache. ? Feeling lightheaded. ? Chills and body aches. ? Not being able to sleep (insomnia).  You or your friends and family think your depression is getting worse. Get help right away if:  You have thoughts of hurting yourself or  others. If you ever feel like you may hurt yourself or others, or have thoughts about taking your own life, get help right away. You can go to  your nearest emergency department or call:  Your local emergency services (911 in the [place]  A suicide crisis helpline, such as the [place] at 4318507502. This is open 24-hours a day.  Summary  If you are living with depression, there are ways to help you recover from it and also ways to prevent it from coming back.  Work with your health care team to create a management plan that includes counseling, stress management techniques, and healthy lifestyle habits. This information is not intended to replace advice given to you by your health care provider. Make sure you discuss any questions you have with your health care provider. Document Released: 03/25/2016 Document Revised: 03/25/2016 Document Reviewed: 03/25/2016 Elsevier Interactive Patient Education  2018 [employer].  Neurogenic Bladder Neurogenic bladder is a bladder control disorder. It is caused by problems with the nerves that control your bladder. This condition may make your bladder overactive or underactive. You may have trouble holding your urine or passing your urine (urinating). The bladder is a hollow organ in the lower part of your abdomen. It stores urine after the urine is made by your kidneys. Normally, when your bladder is not full, the muscles that control your bladder are relaxed. When your bladder fills with urine, nerve signals are sent to your brain, indicating that your bladder is full. Your brain then sends signals through your spinal cord to the muscles in your bladder that start and stop urine flow. If you have neurogenic bladder, the nerves and muscles do not work together the way they should. What are the causes? Any kind of nerve damage or condition that disrupts the signals from your brain to your bladder can cause neurogenic bladder.  Many things can cause these nerve problems. They include:  A disease that affects the nervous system, such as: ? Alzheimer disease. ? Cerebral palsy. ? Multiple sclerosis. ? Diabetes. ? Parkinson disease.  Damage to your brain or spinal cord from: ? Trauma. ? Tumor. ? Infection. ? Surgery. ? Alcohol abuse. ? Stroke. ? A spinal cord birth defect.  What increases the risk? Having nerve damage or a nerve disorder puts you at risk for neurogenic bladder. What are the signs or symptoms? Signs and symptoms of neurogenic bladder include:  Leaking or gushing urine (incontinence).  A sudden, strong urge to pass urine (urgency).  Frequent urination during the day and night.  Being unable to empty your bladder completely (urinary retention).  Frequent urinary tract infections.  How is this diagnosed? Your health care provider may diagnose neurogenic bladder based on your symptoms and medical history. A physical exam will also be done. You may be asked to keep a record of your bladder symptoms and the times that you urinate (bladder diary). Your health care provider may also do several tests to help diagnose neurogenic bladder, including:  A urine test to check for infection.  A bladder scan after you urinate to see how much urine is left in your bladder.  Various tests to measure your urine flow and see how well the flow is controlled (urodynamic tests).  A procedure that involves using a tool that is like a very thin telescope to look through your urethra into your bladder (cystoscopy). A health care provider who specializes in the urinary tract (urologist) may do this test.  Imaging tests of your brain or spine.  How is this treated? Treatment depends on the cause of your neurogenic bladder and the symptoms you are having. Work closely  with your health care provider to find the treatments that will improve your quality of life. Treatment options include:  Learning ways to  control when you urinate, such as: ? Urinating at scheduled times. ? Training yourself to delay urination. ? Doing exercises (Kegel exercises) to strengthen the muscles that control urine flow. ? Avoiding foods or drinks that make your symptoms worse.  Taking medicines to: ? Stimulate an underactive bladder. ? Relax an overactive bladder. ? Treat a urinary tract infection.  Learning how to use a thin tube (catheter) to empty your bladder. A catheter is a hollow tube that you pass through your urethra.  Procedures to stimulate the nerves that control your bladder.  Surgical procedures if other treatments do not help.  Follow these instructions at home:  Keep a bladder diary to find out which foods, liquids, or activities make your symptoms worse.  Use your bladder diary to schedule bathroom trips. If you are away from home, plan to be near a bathroom when your schedule says you need one.  Do Kegel exercises to strengthen the muscles that control urination. These muscles are the ones you use to try to hold urine when you need to go. To do Kegel exercises: ? Squeeze these muscles tight and hold for about 10 seconds. ? Repeat three times. ? Do these exercises often during the day when you do not have to urinate.  Limit your drinking of beverages that stimulate urination. These include soda, coffee, and tea.  After urinating, wait a few minutes and try again (double voiding).  Make sure you urinate just before you leave the house and just before you go to bed.  Take medicines only as directed by your health care provider.  Keep all follow-up visits as directed by your health care provider. This is important. Contact a health care provider if:  You are having a hard time controlling your symptoms.  Your symptoms are getting worse.  You have signs of a urinary tract infection: ? A burning feeling when you urinate. ? Chills. ? Fever. Get help right away if: You cannot pass  urine. This information is not intended to replace advice given to you by your health care provider. Make sure you discuss any questions you have with your health care provider. Document Released: 11/03/2006 Document Revised: 09/28/2015 Document Reviewed: 08/03/2013 Elsevier Interactive Patient Education  2018 [employer].

## 2017-01-27 ENCOUNTER — Telehealth: Payer: Self-pay | Admitting: Emergency Medicine

## 2017-01-27 NOTE — Telephone Encounter (Signed)
Left a VM for patient regarding medical records not being on file from [facility]. [facility] does not have any records for patient

## 2017-01-27 NOTE — Telephone Encounter (Signed)
Spoke with patient regarding [facility] stating thatdo not have any records on file for her. Patient states that she will give the office a call and see what is going and will call us back. Nothing further needed

## 2017-02-13 ENCOUNTER — Other Ambulatory Visit: Payer: Self-pay | Admitting: *Deleted

## 2017-02-13 DIAGNOSIS — N319 Neuromuscular dysfunction of bladder, unspecified: Secondary | ICD-10-CM

## 2017-02-13 MED ORDER — TOLTERODINE TARTRATE 2 MG PO TABS: 2.0000 mg | ORAL_TABLET | Freq: Two times a day (BID) | ORAL | 5 refills | Status: DC

## 2017-08-08 ENCOUNTER — Other Ambulatory Visit: Payer: Self-pay | Admitting: Family Medicine

## 2017-08-08 DIAGNOSIS — F419 Anxiety disorder, unspecified: Secondary | ICD-10-CM

## 2017-08-11 NOTE — Telephone Encounter (Signed)
Pt was called and a detailed voicemail asking pt to make a follow up appointment.

## 2017-08-11 NOTE — Telephone Encounter (Signed)
Pt should be seen for f/u.  Can give a limited 1 or 2 month supply until she is seen in the office.

## 2017-08-11 NOTE — Telephone Encounter (Signed)
Last OV 01/13/17. Ok to refill medication? Please advise.

## 2017-10-03 ENCOUNTER — Ambulatory Visit (INDEPENDENT_AMBULATORY_CARE_PROVIDER_SITE_OTHER): Payer: Medicare Other | Admitting: Family Medicine

## 2017-10-03 ENCOUNTER — Other Ambulatory Visit: Payer: Self-pay

## 2017-10-03 ENCOUNTER — Encounter: Payer: Self-pay | Admitting: Family Medicine

## 2017-10-03 VITALS — BP 132/78 | HR 68 | Temp 98.2°F | Wt 205.0 lb

## 2017-10-03 DIAGNOSIS — N319 Neuromuscular dysfunction of bladder, unspecified: Secondary | ICD-10-CM | POA: Diagnosis not present

## 2017-10-03 DIAGNOSIS — L509 Urticaria, unspecified: Secondary | ICD-10-CM

## 2017-10-03 DIAGNOSIS — F329 Major depressive disorder, single episode, unspecified: Secondary | ICD-10-CM

## 2017-10-03 DIAGNOSIS — F419 Anxiety disorder, unspecified: Secondary | ICD-10-CM

## 2017-10-03 DIAGNOSIS — J3089 Other allergic rhinitis: Secondary | ICD-10-CM | POA: Diagnosis not present

## 2017-10-03 DIAGNOSIS — Z8639 Personal history of other endocrine, nutritional and metabolic disease: Secondary | ICD-10-CM | POA: Diagnosis not present

## 2017-10-03 DIAGNOSIS — E039 Hypothyroidism, unspecified: Secondary | ICD-10-CM | POA: Diagnosis not present

## 2017-10-03 LAB — CBC
HEMATOCRIT: 40.5 % (ref 36.0–46.0)
HEMOGLOBIN: 13.9 g/dL (ref 12.0–15.0)
MCHC: 34.3 g/dL (ref 30.0–36.0)
MCV: 86.1 fl (ref 78.0–100.0)
PLATELETS: 190 10*3/uL (ref 150.0–400.0)
RBC: 4.7 Mil/uL (ref 3.87–5.11)
RDW: 13.5 % (ref 11.5–15.5)
WBC: 8.9 10*3/uL (ref 4.0–10.5)

## 2017-10-03 LAB — BASIC METABOLIC PANEL
BUN: 9 mg/dL (ref 6–23)
CALCIUM: 9.5 mg/dL (ref 8.4–10.5)
CO2: 31 mEq/L (ref 19–32)
CREATININE: 0.75 mg/dL (ref 0.40–1.20)
Chloride: 100 mEq/L (ref 96–112)
GFR: 83.41 mL/min (ref >60.00)
GLUCOSE: 77 mg/dL (ref 70–99)
Potassium: 3.8 mEq/L (ref 3.5–5.1)
Sodium: 140 mEq/L (ref 135–145)

## 2017-10-03 LAB — TSH: TSH: 1.68 u[IU]/mL (ref 0.35–4.50)

## 2017-10-03 LAB — VITAMIN B12: Vitamin B-12: 846 pg/mL (ref 211–911)

## 2017-10-03 MED ORDER — TOLTERODINE TARTRATE 2 MG PO TABS: 2.0000 mg | ORAL_TABLET | Freq: Two times a day (BID) | ORAL | 2 refills | Status: DC

## 2017-10-03 MED ORDER — FEXOFENADINE HCL 180 MG PO TABS: 180.0000 mg | ORAL_TABLET | Freq: Every day | ORAL | 1 refills | Status: DC

## 2017-10-03 MED ORDER — OMEPRAZOLE 20 MG PO CPDR: 20.0000 mg | DELAYED_RELEASE_CAPSULE | Freq: Two times a day (BID) | ORAL | 0 refills | Status: DC

## 2017-10-03 MED ORDER — CYANOCOBALAMIN 1000 MCG/ML IJ SOLN: INTRAMUSCULAR | 2 refills | Status: DC

## 2017-10-03 MED ORDER — SERTRALINE HCL 100 MG PO TABS: 150.0000 mg | ORAL_TABLET | Freq: Every day | ORAL | 2 refills | Status: DC

## 2017-10-03 MED ORDER — LEVOTHYROXINE SODIUM 88 MCG PO TABS: 88.0000 ug | ORAL_TABLET | Freq: Every day | ORAL | 1 refills | Status: DC

## 2017-10-03 MED ORDER — BUSPIRONE HCL 15 MG PO TABS: 15.0000 mg | ORAL_TABLET | Freq: Two times a day (BID) | ORAL | 1 refills | Status: DC

## 2017-10-03 NOTE — Progress Notes (Signed)
Subjective:    Patient ID: Samantha Burch, female    DOB: 12-18-1955, 62 y.o.   MRN: [id number]  No chief complaint on file.   HPI Patient was seen today for follow-up.  Anxiety and depression: -Patient states she has been taking BuSpar 15 mg twice daily, Zoloft 150 mg daily. -Patient endorses decreased energy, poor sleep, depressed mood. -Patient has not yet found a counselor/psychiatrist in the area but now she needs to do this. -Patient endorses family stress, financial stress -Patient also endorses increased stress surrounding a letter to appear for jury duty.  Patient has history of [place] phobia makes it harder for her to be out in crowded spaces.  Urticaria: -The past patient endorses having allergy shots -Patient is taking Allegra 180 mg daily -Patient endorses allergy seeming a little bit worse that she is moved to the area from [place] last August. -Patient is interested in seeing an allergist. -Patient also endorses history of allergy to dust mites. -Patient has carpeting in her apartment.  History of B12 deficiency: -Patient has been giving herself monthly B12 injections -Patient endorses anxiety surrounding giving herself injections as she is afraid she is dying giving herself enough or there may be bubbles in the syringe. -Patient would like to have B12 injections given here in clinic. -Patient has not had recent B12 testing.  Hypothyroidism: -Patient has been taking levothyroxine 88 mcg daily -She denies current symptoms    Past Medical History:  Diagnosis Date  . Anxiety   . Depression   . GERD (gastroesophageal reflux disease)   . Gout   . Hyperlipidemia   . Hypoglycemia   . Hypothyroidism   . Neurogenic bladder   . Neuropathy   . Urinary incontinence     Allergies  Allergen Reactions  . Sulfur     ROS General: Denies fever, chills, night sweats, changes in weight, changes in appetite   HEENT: Denies headaches, ear pain, changes in  vision, rhinorrhea, sore throat  + itchy eyes, sneezing CV: Denies CP, palpitations, SOB, orthopnea Pulm: Denies SOB, cough, wheezing GI: Denies abdominal pain, nausea, vomiting, diarrhea, constipation GU: Denies dysuria, hematuria, frequency, vaginal discharge Msk: Denies muscle cramps, joint pains Neuro: Denies weakness, numbness, tingling Skin: Denies rashes, bruising  + urticaria Psych: Denies hallucinations  + anxiety, depression     Objective:    Blood pressure 132/78, pulse 68, temperature 98.2 F (36.8 C), temperature source Oral, weight 205 lb (93 kg), last menstrual period 05/06/2006, SpO2 96 %.   Gen. Pleasant, well-nourished, in no distress, normal affect   HEENT: [place]/AT, face symmetric, no scleral icterus, PERRLA,nares patent without drainage, pharynx without erythema or exudate.  TMs normal bilaterally.  No cervical lymphadenopathy. Lungs: no accessory muscle use, CTAB, no wheezes or rales Cardiovascular: RRR, no m/r/g, no peripheral edema Neuro:  A&Ox3, CN II-XII intact, normal gait Skin:  Warm, no lesions/ rash   Wt Readings from Last 3 Encounters:  10/03/17 205 lb (93 kg)  01/13/17 197 lb 4.8 oz (89.5 kg)  09/19/16 210 lb (95.3 kg)    Lab Results  Component Value Date   WBC 8.1 01/13/2017   HGB 13.9 01/13/2017   HCT 41.4 01/13/2017   PLT 231.0 01/13/2017   GLUCOSE 87 01/13/2017   ALT 27 01/13/2017   AST 28 01/13/2017   NA 141 01/13/2017   K 3.5 01/13/2017   CL 100 01/13/2017   CREATININE 0.85 01/13/2017   BUN 9 01/13/2017   CO2 29 01/13/2017   TSH  2.60 01/13/2017    Assessment/Plan:  Anxiety and depression -PHQ 9 score 16 -Gad 7 score 15 - Plan: busPIRone (BUSPAR) 15 MG tablet, sertraline (ZOLOFT) 100 MG tablet -Patient encouraged to find an area psychiatrist and start counseling.  Patient is amenable to this plan.  Urticaria -Continue Allegra daily - Plan: Ambulatory referral to Allergy   History of non anemic vitamin B12 deficiency  -  Plan: CBC (no diff), Vitamin B12, cyanocobalamin (,VITAMIN B-12,) 1000 MCG/ML injection  Acquired hypothyroidism  - Plan: TSH, levothyroxine (SYNTHROID, LEVOTHROID) 88 MCG tablet  History of hypokalemia  - Plan: Basic metabolic panel  Environmental and seasonal allergies  - Plan: fexofenadine (ALLEGRA) 180 MG tablet  Neurogenic bladder  - Plan: tolterodine (DETROL) 2 MG tablet  Follow-up in the next 1 to 2 months.  Patient also to schedule CPE in the near future.  Grier Mitts, MD

## 2017-10-03 NOTE — Patient Instructions (Signed)
Living With Depression Everyone experiences occasional disappointment, sadness, and loss in their lives. When you are feeling down, blue, or sad for at least 2 weeks in a row, it may mean that you have depression. Depression can affect your thoughts and feelings, relationships, daily activities, and physical health. It is caused by changes in the way your brain functions. If you receive a diagnosis of depression, your health care provider will tell you which type of depression you have and what treatment options are available to you. If you are living with depression, there are ways to help you recover from it and also ways to prevent it from coming back. How to cope with lifestyle changes Coping with stress Stress is your body's reaction to life changes and events, both good and bad. Stressful situations may include:  Getting married.  The death of a spouse.  Losing a job.  Retiring.  Having a baby.  Stress can last just a few hours or it can be ongoing. Stress can play a major role in depression, so it is important to learn both how to cope with stress and how to think about it differently. Talk with your health care provider or a counselor if you would like to learn more about stress reduction. He or she may suggest some stress reduction techniques, such as:  Music therapy. This can include creating music or listening to music. Choose music that you enjoy and that inspires you.  Mindfulness-based meditation. This kind of meditation can be done while sitting or walking. It involves being aware of your normal breaths, rather than trying to control your breathing.  Centering prayer. This is a kind of meditation that involves focusing on a spiritual word or phrase. Choose a word, phrase, or sacred image that is meaningful to you and that brings you peace.  Deep breathing. To do this, expand your stomach and inhale slowly through your nose. Hold your breath for 3-5 seconds, then exhale  slowly, allowing your stomach muscles to relax.  Muscle relaxation. This involves intentionally tensing muscles then relaxing them.  Choose a stress reduction technique that fits your lifestyle and personality. Stress reduction techniques take time and practice to develop. Set aside 5-15 minutes a day to do them. Therapists can offer training in these techniques. The training may be covered by some insurance plans. Other things you can do to manage stress include:  Keeping a stress diary. This can help you learn what triggers your stress and ways to control your response.  Understanding what your limits are and saying no to requests or events that lead to a schedule that is too full.  Thinking about how you respond to certain situations. You may not be able to control everything, but you can control how you react.  Adding humor to your life by watching funny films or TV shows.  Making time for activities that help you relax and not feeling guilty about spending your time this way.  Medicines Your health care provider may suggest certain medicines if he or she feels that they will help improve your condition. Avoid using alcohol and other substances that may prevent your medicines from working properly (may interact). It is also important to:  Talk with your pharmacist or health care provider about all the medicines that you take, their possible side effects, and what medicines are safe to take together.  Make it your goal to take part in all treatment decisions (shared decision-making). This includes giving input on the side   effects of medicines. It is best if shared decision-making with your health care provider is part of your total treatment plan.  If your health care provider prescribes a medicine, you may not notice the full benefits of it for 4-8 weeks. Most people who are treated for depression need to be on medicine for at least 6-12 months after they feel better. If you are taking  medicines as part of your treatment, do not stop taking medicines without first talking to your health care provider. You may need to have the medicine slowly decreased (tapered) over time to decrease the risk of harmful side effects. Relationships Your health care provider may suggest family therapy along with individual therapy and drug therapy. While there may not be family problems that are causing you to feel depressed, it is still important to make sure your family learns as much as they can about your mental health. Having your family's support can help make your treatment successful. How to recognize changes in your condition Everyone has a different response to treatment for depression. Recovery from major depression happens when you have not had signs of major depression for two months. This may mean that you will start to:  Have more interest in doing activities.  Feel less hopeless than you did 2 months ago.  Have more energy.  Overeat less often, or have better or improving appetite.  Have better concentration.  Your health care provider will work with you to decide the next steps in your recovery. It is also important to recognize when your condition is getting worse. Watch for these signs:  Having fatigue or low energy.  Eating too much or too little.  Sleeping too much or too little.  Feeling restless, agitated, or hopeless.  Having trouble concentrating or making decisions.  Having unexplained physical complaints.  Feeling irritable, angry, or aggressive.  Get help as soon as you or your family members notice these symptoms coming back. How to get support and help from others How to talk with friends and family members about your condition Talking to friends and family members about your condition can provide you with one way to get support and guidance. Reach out to trusted friends or family members, explain your symptoms to them, and let them know that you are  working with a health care provider to treat your depression. Financial resources Not all insurance plans cover mental health care, so it is important to check with your insurance carrier. If paying for co-pays or counseling services is a problem, search for a local or county mental health care center. They may be able to offer public mental health care services at low or no cost when you are not able to see a private health care provider. If you are taking medicine for depression, you may be able to get the generic form, which may be less expensive. Some makers of prescription medicines also offer help to patients who cannot afford the medicines they need. Follow these instructions at home:  Get the right amount and quality of sleep.  Cut down on using caffeine, tobacco, alcohol, and other potentially harmful substances.  Try to exercise, such as walking or lifting small weights.  Take over-the-counter and prescription medicines only as told by your health care provider.  Eat a healthy diet that includes plenty of vegetables, fruits, whole grains, low-fat dairy products, and lean protein. Do not eat a lot of foods that are high in solid fats, added sugars, or salt.    Keep all follow-up visits as told by your health care provider. This is important. Contact a health care provider if:  You stop taking your antidepressant medicines, and you have any of these symptoms: ? Nausea. ? Headache. ? Feeling lightheaded. ? Chills and body aches. ? Not being able to sleep (insomnia).  You or your friends and family think your depression is getting worse. Get help right away if:  You have thoughts of hurting yourself or others. If you ever feel like you may hurt yourself or others, or have thoughts about taking your own life, get help right away. You can go to your nearest emergency department or [place]:  Your local emergency services (911 in the [place]  A suicide crisis helpline, such as the  [place] at 603-133-2373. This is open 24-hours a day.  Summary  If you are living with depression, there are ways to help you recover from it and also ways to prevent it from coming back.  Work with your health care team to create a management plan that includes counseling, stress management techniques, and healthy lifestyle habits. This information is not intended to replace advice given to you by your health care provider. Make sure you discuss any questions you have with your health care provider. Document Released: 03/25/2016 Document Revised: 03/25/2016 Document Reviewed: 03/25/2016 Elsevier Interactive Patient Education  2018 [employer] After being diagnosed with an anxiety disorder, you may be relieved to know why you have felt or behaved a certain way. It is natural to also feel overwhelmed about the treatment ahead and what it will mean for your life. With care and support, you can manage this condition and recover from it. How to cope with anxiety Dealing with stress Stress is your body's reaction to life changes and events, both good and bad. Stress can last just a few hours or it can be ongoing. Stress can play a major role in anxiety, so it is important to learn both how to cope with stress and how to think about it differently. Talk with your health care provider or a counselor to learn more about stress reduction. He or she may suggest some stress reduction techniques, such as:  Music therapy. This can include creating or listening to music that you enjoy and that inspires you.  Mindfulness-based meditation. This involves being aware of your normal breaths, rather than trying to control your breathing. It can be done while sitting or walking.  Centering prayer. This is a kind of meditation that involves focusing on a word, phrase, or sacred image that is meaningful to you and that brings you peace.  Deep breathing. To do  this, expand your stomach and inhale slowly through your nose. Hold your breath for 3-5 seconds. Then exhale slowly, allowing your stomach muscles to relax.  Self-talk. This is a skill where you identify thought patterns that lead to anxiety reactions and correct those thoughts.  Muscle relaxation. This involves tensing muscles then relaxing them.  Choose a stress reduction technique that fits your lifestyle and personality. Stress reduction techniques take time and practice. Set aside 5-15 minutes a day to do them. Therapists can offer training in these techniques. The training may be covered by some insurance plans. Other things you can do to manage stress include:  Keeping a stress diary. This can help you learn what triggers your stress and ways to control your response.  Thinking about how you respond to certain situations. You  may not be able to control everything, but you can control your reaction.  Making time for activities that help you relax, and not feeling guilty about spending your time in this way.  Therapy combined with coping and stress-reduction skills provides the best chance for successful treatment. Medicines Medicines can help ease symptoms. Medicines for anxiety include:  Anti-anxiety drugs.  Antidepressants.  Beta-blockers.  Medicines may be used as the main treatment for anxiety disorder, along with therapy, or if other treatments are not working. Medicines should be prescribed by a health care provider. Relationships Relationships can play a big part in helping you recover. Try to spend more time connecting with trusted friends and family members. Consider going to couples counseling, taking family education classes, or going to family therapy. Therapy can help you and others better understand the condition. How to recognize changes in your condition Everyone has a different response to treatment for anxiety. Recovery from anxiety happens when symptoms decrease  and stop interfering with your daily activities at home or work. This may mean that you will start to:  Have better concentration and focus.  Sleep better.  Be less irritable.  Have more energy.  Have improved memory.  It is important to recognize when your condition is getting worse. Contact your health care provider if your symptoms interfere with home or work and you do not feel like your condition is improving. Where to find help and support: You can get help and support from these sources:  Self-help groups.  Online and [employer].  A trusted spiritual leader.  Couples counseling.  Family education classes.  Family therapy.  Follow these instructions at home:  Eat a healthy diet that includes plenty of vegetables, fruits, whole grains, low-fat dairy products, and lean protein. Do not eat a lot of foods that are high in solid fats, added sugars, or salt.  Exercise. Most adults should do the following: ? Exercise for at least 150 minutes each week. The exercise should increase your heart rate and make you sweat (moderate-intensity exercise). ? Strengthening exercises at least twice a week.  Cut down on caffeine, tobacco, alcohol, and other potentially harmful substances.  Get the right amount and quality of sleep. Most adults need 7-9 hours of sleep each night.  Make choices that simplify your life.  Take over-the-counter and prescription medicines only as told by your health care provider.  Avoid caffeine, alcohol, and certain over-the-counter cold medicines. These may make you feel worse. Ask your pharmacist which medicines to avoid.  Keep all follow-up visits as told by your health care provider. This is important. Questions to ask your health care provider  Would I benefit from therapy?  How often should I follow up with a health care provider?  How long do I need to take medicine?  Are there any long-term side effects of my medicine?  Are  there any alternatives to taking medicine? Contact a health care provider if:  You have a hard time staying focused or finishing daily tasks.  You spend many hours a day feeling worried about everyday life.  You become exhausted by worry.  You start to have headaches, feel tense, or have nausea.  You urinate more than normal.  You have diarrhea. Get help right away if:  You have a racing heart and shortness of breath.  You have thoughts of hurting yourself or others. If you ever feel like you may hurt yourself or others, or have thoughts about taking your own  own life, get help right away. You can go to your nearest emergency department or call:  Your local emergency services (911 in the [place]  A suicide crisis helpline, such as the National Suicide Prevention Lifeline at 1-800-273-8255. This is open 24-hours a day.  Summary  Taking steps to deal with stress can help calm you.  Medicines cannot cure anxiety disorders, but they can help ease symptoms.  Family, friends, and partners can play a big part in helping you recover from an anxiety disorder. This information is not intended to replace advice given to you by your health care provider. Make sure you discuss any questions you have with your health care provider. Document Released: 04/16/2016 Document Revised: 04/16/2016 Document Reviewed: 04/16/2016 Elsevier Interactive Patient Education  2018 [employer]  

## 2017-10-22 ENCOUNTER — Other Ambulatory Visit: Payer: Self-pay | Admitting: Family Medicine

## 2017-10-22 ENCOUNTER — Encounter: Payer: Self-pay | Admitting: Allergy

## 2017-10-22 DIAGNOSIS — F419 Anxiety disorder, unspecified: Secondary | ICD-10-CM

## 2017-10-24 ENCOUNTER — Other Ambulatory Visit: Payer: Self-pay

## 2017-10-24 DIAGNOSIS — G609 Hereditary and idiopathic neuropathy, unspecified: Secondary | ICD-10-CM

## 2017-10-24 MED ORDER — LYRICA 225 MG PO CAPS: 225.0000 mg | ORAL_CAPSULE | Freq: Two times a day (BID) | ORAL | 3 refills | Status: DC

## 2017-11-21 ENCOUNTER — Telehealth: Payer: Self-pay | Admitting: *Deleted

## 2017-11-21 ENCOUNTER — Ambulatory Visit: Payer: Self-pay | Admitting: Allergy

## 2017-11-21 NOTE — Telephone Encounter (Signed)
Reference #[id number].

## 2017-11-21 NOTE — Telephone Encounter (Signed)
Prior auth for Tolterodine 2mg  unable to be submitted via [email or web address].  I called [facility] [phone] and spoke with Quita Skye.  He was given information for the pt and stated this will be submitted and a response should be received in 48-72 hours.

## 2017-11-21 NOTE — Telephone Encounter (Signed)
Spoke with [facility]. They have approved medication and will fax over authorization.

## 2017-11-21 NOTE — Telephone Encounter (Signed)
I called [facility] and left a detailed message on the voicemail with this information.

## 2017-11-26 ENCOUNTER — Ambulatory Visit: Payer: Self-pay | Admitting: Allergy & Immunology

## 2017-12-05 ENCOUNTER — Ambulatory Visit: Payer: Medicare Other | Admitting: Neurology

## 2017-12-29 ENCOUNTER — Ambulatory Visit: Payer: Self-pay | Admitting: Allergy & Immunology

## 2017-12-31 ENCOUNTER — Encounter: Payer: Self-pay | Admitting: Family Medicine

## 2017-12-31 ENCOUNTER — Ambulatory Visit (INDEPENDENT_AMBULATORY_CARE_PROVIDER_SITE_OTHER): Payer: Medicare Other

## 2017-12-31 ENCOUNTER — Ambulatory Visit (INDEPENDENT_AMBULATORY_CARE_PROVIDER_SITE_OTHER): Payer: Medicare Other | Admitting: Family Medicine

## 2017-12-31 VITALS — BP 126/80 | HR 70 | Temp 98.3°F | Resp 12 | Ht 60.5 in | Wt 209.2 lb

## 2017-12-31 DIAGNOSIS — M109 Gout, unspecified: Secondary | ICD-10-CM | POA: Diagnosis not present

## 2017-12-31 DIAGNOSIS — M79671 Pain in right foot: Secondary | ICD-10-CM

## 2017-12-31 DIAGNOSIS — M7989 Other specified soft tissue disorders: Secondary | ICD-10-CM | POA: Diagnosis not present

## 2017-12-31 LAB — URIC ACID: URIC ACID, SERUM: 6.5 mg/dL (ref 2.4–7.0)

## 2017-12-31 MED ORDER — PREDNISONE 20 MG PO TABS: 40.0000 mg | ORAL_TABLET | Freq: Every day | ORAL | 0 refills | Status: AC

## 2017-12-31 NOTE — Progress Notes (Addendum)
ACUTE VISIT   HPI:  Chief Complaint  Patient presents with  . Gout    bilateral feet and ankles, started a couple of weeks ago    Samantha Burch is a 62 y.o. female, who is here today complaining of bilateral foot edema and right foot pain. She has had lower extremity edema intermittently for a while but usually resolved after elevation.  For the past couple weeks right foot edema has been worse and constant, also started with aching pain, no history of foot trauma.  She thinks symptoms are caused by gout. She is reporting "a few" episodes of gout in the past year.  Pain seems to be getting worse, last night it was 10/10, constant. It is exacerbated by standing up and walking, alleviated by rest. Needle sensation, she has history of peripheral neuropathy.  She has not noted leg pain or erythema.  She has taken OTC Advil, which has helped. He has not noted fever, chills, palpitations, dyspnea, or chest pain.   Review of Systems  Constitutional: Negative for chills and fever.  Gastrointestinal: Negative for nausea and vomiting.  Musculoskeletal: Positive for arthralgias, gait problem and joint swelling.  Skin: Positive for color change. Negative for pallor and wound.  Neurological: Positive for numbness. Negative for weakness.      Current Outpatient Medications on File Prior to Visit  Medication Sig Dispense Refill  . busPIRone (BUSPAR) 15 MG tablet Take 1 tablet (15 mg total) by mouth 2 (two) times daily. 180 tablet 1  . cyanocobalamin (,VITAMIN B-12,) 1000 MCG/ML injection INJECT 1 ML IM ONCE MONTHLY 10 mL 2  . fexofenadine (ALLEGRA) 180 MG tablet Take 1 tablet (180 mg total) by mouth daily. 90 tablet 1  . levothyroxine (SYNTHROID, LEVOTHROID) 88 MCG tablet Take 1 tablet (88 mcg total) by mouth daily. 90 tablet 1  . LYRICA 225 MG capsule Take 1 capsule (225 mg total) by mouth 2 (two) times daily. 180 capsule 3  . omeprazole (PRILOSEC) 20 MG capsule  Take 1 capsule (20 mg total) by mouth 2 (two) times daily before a meal. 180 capsule 0  . potassium chloride (MICRO-K) 10 MEQ CR capsule Take 1 capsule (10 mEq total) by mouth daily. 90 capsule 1  . sertraline (ZOLOFT) 100 MG tablet Take 1.5 tablets (150 mg total) by mouth daily. 135 tablet 2  . tolterodine (DETROL) 2 MG tablet Take 1 tablet (2 mg total) by mouth 2 (two) times daily. 180 tablet 2   No current facility-administered medications on file prior to visit.      Past Medical History:  Diagnosis Date  . Anxiety   . Depression   . GERD (gastroesophageal reflux disease)   . Gout   . Hyperlipidemia   . Hypoglycemia   . Hypothyroidism   . Neurogenic bladder   . Neuropathy   . Urinary incontinence    Allergies  Allergen Reactions  . Sulfur     Social History   Socioeconomic History  . Marital status: Divorced    Spouse name: Not on file  . Number of children: Not on file  . Years of education: Not on file  . Highest education level: Not on file  Occupational History  . Occupation: Disabled  Social Needs  . Financial resource strain: Not on file  . Food insecurity:    Worry: Not on file    Inability: Not on file  . Transportation needs:    Medical: Not on file  Non-medical: Not on file  Tobacco Use  . Smoking status: Former Smoker    Packs/day: 1.00    Years: 30.00    Pack years: 30.00    Types: Cigarettes    Last attempt to quit: 05/07/2011    Years since quitting: 6.6  . Smokeless tobacco: Never Used  Substance and Sexual Activity  . Alcohol use: No  . Drug use: No  . Sexual activity: Not on file  Lifestyle  . Physical activity:    Days per week: Not on file    Minutes per session: Not on file  . Stress: Not on file  Relationships  . Social connections:    Talks on phone: Not on file    Gets together: Not on file    Attends religious service: Not on file    Active member of club or organization: Not on file    Attends meetings of clubs or  organizations: Not on file    Relationship status: Not on file  Other Topics Concern  . Not on file  Social History Narrative  . Not on file    Vitals:   12/31/17 1322  BP: 126/80  Pulse: 70  Resp: 12  Temp: 98.3 F (36.8 C)  SpO2: 97%   Body mass index is 40.19 kg/m.   Physical Exam  Nursing note and vitals reviewed. Constitutional: She is oriented to person, place, and time. She appears well-developed. She does not appear ill. No distress.  HENT:  Head: Normocephalic and atraumatic.  Eyes: Conjunctivae are normal.  Cardiovascular: Normal rate and regular rhythm.  DP and PT pulses present bilateral.  Respiratory: Effort normal and breath sounds normal. No respiratory distress.  Musculoskeletal: She exhibits edema (Trace pitting LE edema,bilateral).       Right foot: There is tenderness, bony tenderness and swelling (Local heat.). There is normal capillary refill and no deformity.  Tenderness upon palpation of fifth metatarsal and MTC joint.   Neurological: She is alert and oriented to person, place, and time. She has normal strength.  Skin: Skin is warm. No rash noted. There is erythema.  Erythema and local heat lateral aspect of right foot.  Psychiatric: Her mood appears anxious.  Fairly groomed, good eye contact.      ASSESSMENT AND PLAN:  Ms.Laiah was seen today for gout.  Diagnoses and all orders for this visit:  Foot pain, right -     DG Foot Complete Right; Future  Gout of right foot, unspecified cause, unspecified chronicity -     Uric acid -     predniSONE (DELTASONE) 20 MG tablet; Take 2 tablets (40 mg total) by mouth daily with breakfast for 5 days.   Lab Results  Component Value Date   LABURIC 6.5 12/31/2017    We discussed possible etiologies of foot pain and edema, including OA, PAD, and peripheral neuropathy.  Today peripheral pulses are present. I am not convinced symptoms are caused entirely by gout, problem has been going on for a  couple weeks at least.   Given her history of gout, I recommend prednisone 40 mg for 5 days. Low purine diet.  We discussed side effects. Lower extremity elevation above waist level.  Further recommendation will be given according to imaging results.   Follow-up with PCP as needed.   Return if symptoms worsen or fail to improve.     Kerrigan Glendening G. Martinique, MD  [facility]. [place] office.

## 2017-12-31 NOTE — Patient Instructions (Addendum)
Samantha Burch I have seen you today for an acute visit.  A few things to remember from today's visit:   Foot pain, right - Plan: DG Foot Complete Right  Gout of right foot, unspecified cause, unspecified chronicity - Plan: Uric acid, predniSONE (DELTASONE) 20 MG tablet   If medications prescribed today, they will not be refill upon request, a follow up appointment with PCP will be necessary to discuss continuation of of treatment if appropriate.  Start taking prednisone with food. Elevate your leg a few times during the day. Please follow with your doctor if you are not better in 2 weeks.  Low-Purine Diet Purines are compounds that affect the level of uric acid in your body. A low-purine diet is a diet that is low in purines. Eating a low-purine diet can prevent the level of uric acid in your body from getting too high and causing gout or kidney stones or both. WHAT DO I NEED TO KNOW ABOUT THIS DIET?  Choose low-purine foods. Examples of low-purine foods are listed in the next section.  Drink plenty of fluids, especially water. Fluids can help remove uric acid from your body. Try to drink 8-16 cups (1.9-3.8 L) a day.  Limit foods high in fat, especially saturated fat, as fat makes it harder for the body to get rid of uric acid. Foods high in saturated fat include pizza, cheese, ice cream, whole milk, fried foods, and gravies. Choose foods that are lower in fat and lean sources of protein. Use olive oil when cooking as it contains healthy fats that are not high in saturated fat.  Limit alcohol. Alcohol interferes with the elimination of uric acid from your body. If you are having a gout attack, avoid all alcohol.  Keep in mind that different people's bodies react differently to different foods. You will probably learn over time which foods do or do not affect you. If you discover that a food tends to cause your gout to flare up, avoid eating that food. You can more freely enjoy  foods that do not cause problems. If you have any questions about a food item, talk to your dietitian or health care provider. WHICH FOODS ARE LOW, MODERATE, AND HIGH IN PURINES? The following is a list of foods that are low, moderate, and high in purines. You can eat any amount of the foods that are low in purines. You may be able to have small amounts of foods that are moderate in purines. Ask your health care provider how much of a food moderate in purines you can have. Avoid foods high in purines. Grains  Foods low in purines: Enriched white bread, pasta, rice, cake, cornbread, popcorn.  Foods moderate in purines: Whole-grain breads and cereals, wheat germ, bran, oatmeal. Uncooked oatmeal. Dry wheat bran or wheat germ.  Foods high in purines: Pancakes, [place] toast, biscuits, muffins. Vegetables  Foods low in purines: All vegetables, except those that are moderate in purines.  Foods moderate in purines: Asparagus, cauliflower, spinach, mushrooms, green peas. Fruits  All fruits are low in purines. Meats and other Protein Foods  Foods low in purines: Eggs, nuts, peanut butter.  Foods moderate in purines: 80-90% lean beef, lamb, veal, pork, poultry, fish, eggs, peanut butter, nuts. Crab, lobster, oysters, and shrimp. Cooked dried beans, peas, and lentils.  Foods high in purines: Anchovies, sardines, herring, mussels, tuna, codfish, scallops, trout, and haddock. Berniece Salines. Organ meats (such as liver or kidney). Tripe. Game meat. Goose. Sweetbreads. Dairy  All  dairy foods are low in purines. Low-fat and fat-free dairy products are best because they are low in saturated fat. Beverages  Drinks low in purines: Water, carbonated beverages, tea, coffee, cocoa.  Drinks moderate in purines: Soft drinks and other drinks sweetened with high-fructose corn syrup. Juices. To find whether a food or drink is sweetened with high-fructose corn syrup, look at the ingredients list.  Drinks high in  purines: Alcoholic beverages (such as beer). Condiments  Foods low in purines: Salt, herbs, olives, pickles, relishes, vinegar.  Foods moderate in purines: Butter, margarine, oils, mayonnaise. Fats and Oils  Foods low in purines: All types, except gravies and sauces made with meat.  Foods high in purines: Gravies and sauces made with meat. Other Foods  Foods low in purines: Sugars, sweets, gelatin. Cake. Soups made without meat.  Foods moderate in purines: Meat-based or fish-based soups, broths, or bouillons. Foods and drinks sweetened with high-fructose corn syrup.  Foods high in purines: High-fat desserts (such as ice cream, cookies, cakes, pies, doughnuts, and chocolate). Contact your dietitian for more information on foods that are not listed here.   This information is not intended to replace advice given to you by your health care provider. Make sure you discuss any questions you have with your health care provider.   Document Released: 08/17/2010 Document Revised: 04/27/2013 Document Reviewed: 03/29/2013 Elsevier Interactive Patient Education [employer].   In general please monitor for signs of worsening symptoms and seek immediate medical attention if any concerning.   I hope you get better soon!

## 2018-01-06 ENCOUNTER — Encounter: Payer: Self-pay | Admitting: Family Medicine

## 2018-01-12 ENCOUNTER — Ambulatory Visit
Admission: RE | Admit: 2018-01-12 | Discharge: 2018-01-12 | Disposition: A | Discharge status: Home / Self Care | Payer: Medicare Other | Source: Ambulatory Visit | Attending: Neurology | Admitting: Neurology

## 2018-01-12 ENCOUNTER — Encounter: Payer: Self-pay | Admitting: Neurology

## 2018-01-12 ENCOUNTER — Ambulatory Visit (INDEPENDENT_AMBULATORY_CARE_PROVIDER_SITE_OTHER): Payer: Medicare Other | Admitting: Neurology

## 2018-01-12 ENCOUNTER — Other Ambulatory Visit: Payer: Self-pay

## 2018-01-12 VITALS — BP 128/74 | HR 67 | Ht 61.5 in | Wt 207.0 lb

## 2018-01-12 DIAGNOSIS — G609 Hereditary and idiopathic neuropathy, unspecified: Secondary | ICD-10-CM

## 2018-01-12 DIAGNOSIS — M25551 Pain in right hip: Secondary | ICD-10-CM | POA: Diagnosis not present

## 2018-01-12 DIAGNOSIS — M79604 Pain in right leg: Secondary | ICD-10-CM

## 2018-01-12 MED ORDER — PREGABALIN 225 MG PO CAPS: 225.0000 mg | ORAL_CAPSULE | Freq: Two times a day (BID) | ORAL | 3 refills | Status: DC

## 2018-01-12 NOTE — Patient Instructions (Addendum)
1. Schedule right hip xray  Please make your way to the first floor of this building, to [facility], for your RIGHT HIP XRAY.  2. Refer to Physical therapy for right leg/hip pain 3. Continue Lyrica 225mg  twice a day 4. Follow-up in 1 year, call for any changes

## 2018-01-12 NOTE — Progress Notes (Signed)
NEUROLOGY FOLLOW UP OFFICE NOTE  Samantha Burch [id number] 1955-09-04  HISTORY OF PRESENT ILLNESS: I had the pleasure of seeing Samantha Burch in follow-up in the neurology clinic on 01/12/2018.  The patient was last seen more than a year ago for peripheral polyneuropathy. She is alone in the office today. Since her last visit, she reports her neuropathy is "up and down," but does not want to increase Lyrica dose. She has been taking Lyrica 225mg  BID for many years without significant side effects. She denies any falls. She has had several issues that she is unsure are related to her neuropathy. She reports having significant feet/ankle swelling with right foot painful, red, and hot. She saw Dr. Martinique and was prescribed a course of Prednisone which significantly helped with the swelling. She has not had any further swelling since then. She also reports that she generally does not feel well, but on the prednisone overall felt much better. She is concerned about circulation in her feet. She also reports pain in the right hip radiating down her leg, sometimes it burns. She has bilateral knee pain and pain at the base of her right thumb.   History on Initial Assessment 09/19/2016: This is a very pleasant 62 year old right-handed woman with a history of hypothyroidism, neurogenic bladder, depression, anxiety, presenting to establish care for peripheral polyneuropathy. She recently moved to [place] from [place] with her son. She had been seeing neurologist Dr. [name] for the neuropathy. 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 Symptoms worsened over time with significant pain in both legs described as pain inside her legs like she wanted to scratch them all the time. She described pins and needles sensation, sometimes affecting her arms as well. She was initially started on gabapentin but it stopped working, and has been  taking Lyrica for the past 10 years. She is taking Lyrica 225mg  BID without side effects. She reports symptom control is much better but not perfect, there are days the neuropathy bothers her, more when she is inactive. She has found that moving around helps relieve the symptoms. She fell last year and broke her left foot, and since then has tended to veer when she gets up. She denies any back pain, but reports pain in the right hip region radiating down the back of her right leg. She tells me her neurologist told her she has unusually brisk reflexes for the neuropathy, per records she has had an MRI brain, cervical, thoracic spine that were unremarkable. NCS reported as normal. Her B12 level was low and she has been on monthly B12 injections. She has a history of vertigo that significantly improved with vestibular therapy, she was told she has gaze instability, and has intermittent vertigo with positional changes. She denies any headaches, tinnitus, diplopia, dysarthria/dysphagia, neck pain, bowel/bladder dysfunction. She has a history of neurogenic bladder on Detrol, low potassium on daily potassium supplements, and depression/anxiety on Zoloft. She reports a family history of peripheral polyneuropathy in her father and paternal grandfather.  PAST MEDICAL HISTORY: Past Medical History:  Diagnosis Date  . Anxiety   . Depression   . GERD (gastroesophageal reflux disease)   . Gout   . Hyperlipidemia   . Hypoglycemia   . Hypothyroidism   . Neurogenic bladder   . Neuropathy   . Urinary incontinence     MEDICATIONS: Current Outpatient Medications on File Prior to Visit  Medication Sig Dispense Refill  . busPIRone (BUSPAR) 15 MG tablet Take 1 tablet (15 mg total) by mouth 2 (two) times daily. 180 tablet 1  . cyanocobalamin (,VITAMIN B-12,) 1000 MCG/ML injection INJECT 1 ML IM ONCE MONTHLY 10 mL 2  . fexofenadine (ALLEGRA) 180 MG tablet Take 1 tablet (180 mg total) by mouth daily. 90 tablet 1  .  levothyroxine (SYNTHROID, LEVOTHROID) 88 MCG tablet Take 1 tablet (88 mcg total) by mouth daily. 90 tablet 1  . LYRICA 225 MG capsule Take 1 capsule (225 mg total) by mouth 2 (two) times daily. 180 capsule 3  . omeprazole (PRILOSEC) 20 MG capsule Take 1 capsule (20 mg total) by mouth 2 (two) times daily before a meal. 180 capsule 0  . potassium chloride (MICRO-K) 10 MEQ CR capsule Take 1 capsule (10 mEq total) by mouth daily. 90 capsule 1  . sertraline (ZOLOFT) 100 MG tablet Take 1.5 tablets (150 mg total) by mouth daily. 135 tablet 2  . tolterodine (DETROL) 2 MG tablet Take 1 tablet (2 mg total) by mouth 2 (two) times daily. 180 tablet 2   No current facility-administered medications on file prior to visit.     ALLERGIES: Allergies  Allergen Reactions  . Sulfur     FAMILY HISTORY: Family History  Problem Relation Age of Onset  . Ovarian cancer Mother   . Alcohol abuse Father   . Hypertension Father   . Diabetes type II Father   . Neuropathy Father   . Neuropathy Paternal Grandmother   . Colon cancer Maternal Uncle     SOCIAL HISTORY: Social History   Socioeconomic History  . Marital status: Divorced    Spouse name: Not on file  . Number of children: Not on file  . Years of education: Not on file  . Highest education level: Not on file  Occupational History  . Occupation: Disabled  Social Needs  . Financial resource strain: Not on file  . Food insecurity:    Worry: Not on file    Inability: Not on file  . Transportation needs:    Medical: Not on file    Non-medical: Not on file  Tobacco Use  . Smoking status: Former Smoker    Packs/day: 1.00    Years: 30.00    Pack years: 30.00    Types: Cigarettes    Last attempt to quit: 05/07/2011    Years since quitting: 6.6  . Smokeless tobacco: Never Used  Substance and Sexual Activity  . Alcohol use: No  . Drug use: No  . Sexual activity: Not on file  Lifestyle  . Physical activity:    Days per week: Not on file     Minutes per session: Not on file  . Stress: Not on file  Relationships  . Social connections:    Talks on phone: Not on file    Gets together: Not on file    Attends religious service: Not on file    Active member of club or organization: Not on file    Attends meetings of clubs or organizations: Not on file    Relationship status: Not on file  . Intimate partner violence:    Fear of current or ex partner: Not on file    Emotionally abused: Not on file    Physically abused: Not on file    Forced sexual activity: Not on file  Other Topics Concern  . Not on file  Social History Narrative  . Not on file  REVIEW OF SYSTEMS: Constitutional: No fevers, chills, or sweats, no generalized fatigue, change in appetite Eyes: No visual changes, double vision, eye pain Ear, nose and throat: No hearing loss, ear pain, nasal congestion, sore throat Cardiovascular: No chest pain, palpitations Respiratory:  No shortness of breath at rest or with exertion, wheezes GastrointestinaI: No nausea, vomiting, diarrhea, abdominal pain, fecal incontinence Genitourinary:  No dysuria, urinary retention or frequency Musculoskeletal:  No neck pain, back pain Integumentary: No rash, pruritus, skin lesions Neurological: as above Psychiatric: No depression, insomnia, anxiety Endocrine: No palpitations, fatigue, diaphoresis, mood swings, change in appetite, change in weight, increased thirst Hematologic/Lymphatic:  No anemia, purpura, petechiae. Allergic/Immunologic: no itchy/runny eyes, nasal congestion, recent allergic reactions, rashes  PHYSICAL EXAM: Vitals:   01/12/18 1314  BP: 128/74  Pulse: 67  SpO2: 97%   General: No acute distress Head:  Normocephalic/atraumatic Neck: supple, no paraspinal tenderness, full range of motion Heart:  Regular rate and rhythm Lungs:  Clear to auscultation bilaterally Back: No paraspinal tenderness Skin/Extremities: No rash, minimal bilateral pitting  edema Neurological Exam: alert and oriented to person, place, and time. No aphasia or dysarthria. Fund of knowledge is appropriate.  Recent and remote memory are intact.  Attention and concentration are normal.    Able to name objects and repeat phrases. Cranial nerves: Pupils equal, round, reactive to light.  Extraocular movements intact with no nystagmus. Visual fields full. Facial sensation intact. No facial asymmetry. Tongue, uvula, palate midline.  Motor: Bulk and tone normal, muscle strength 5/5 throughout with no pronator drift.  Sensation to light touch, temperature, pin on both UE, decreased pin, cold and vibration sense to ankles bilaterally.  No extinction to double simultaneous stimulation.  Deep tendon reflexes brisk +3 throughout with +bilateral Hoffman sign (similar to prior. Toes downgoing.  Finger to nose testing intact.  Gait narrow-based and steady, able to tandem walk adequately.  Romberg negative.  IMPRESSION: This is a very pleasant 62 yo RH woman with a history of hypothyroidism, neurogenic bladder, depression, anxiety, with peripheral polyneuropathy. She is again noted to be hyperreflexic. Per prior notes, similar findings were seen and she has an unremarkable MRI brain, cervical, and thoracic spine, and normal nerve conduction studies. She would like to continue on current dose of Lyrica 225mg  BID, refills sent. She is reporting right hip pain, xray will be ordered and referral to PT sent. She is concerned about poor circulation, we discussed how swelling resolved with prednisone is unlikely due to poor circulation, continue follow-up with PCP. She will follow-up in 1 year and knows to call for any changes.  Thank you for allowing me to participate in her care.  Please do not hesitate to call for any questions or concerns.  The duration of this appointment visit was 27 minutes of face-to-face time with the patient.  Greater than 50% of this time was spent in counseling, explanation  of diagnosis, planning of further management, and coordination of care.   Ellouise Newer, M.D.   CC: Dr. Volanda Napoleon

## 2018-01-13 ENCOUNTER — Other Ambulatory Visit: Payer: Self-pay | Admitting: Neurology

## 2018-01-13 DIAGNOSIS — M79604 Pain in right leg: Secondary | ICD-10-CM

## 2018-01-13 DIAGNOSIS — G609 Hereditary and idiopathic neuropathy, unspecified: Secondary | ICD-10-CM

## 2018-01-13 DIAGNOSIS — M25551 Pain in right hip: Secondary | ICD-10-CM

## 2018-01-14 ENCOUNTER — Telehealth: Payer: Self-pay

## 2018-01-14 NOTE — Telephone Encounter (Signed)
Spoke with pt relaying message below.  She states that she will look into PT - she wants to make sure that she can afford it, etc.

## 2018-01-14 NOTE — Telephone Encounter (Signed)
-----   Message from Cameron Sprang, MD sent at 01/14/2018  1:59 PM EDT ----- Pls let her know the xray did not show significant arthritis changes in the hip, but did show arthritis changes in her lower back. Proceed with PT. Thanks

## 2018-02-14 ENCOUNTER — Other Ambulatory Visit: Payer: Self-pay | Admitting: Family Medicine

## 2018-03-10 ENCOUNTER — Encounter: Payer: Self-pay | Admitting: Family Medicine

## 2018-03-10 ENCOUNTER — Ambulatory Visit (INDEPENDENT_AMBULATORY_CARE_PROVIDER_SITE_OTHER): Payer: Medicare Other | Admitting: Family Medicine

## 2018-03-10 ENCOUNTER — Ambulatory Visit: Payer: Medicare Other

## 2018-03-10 VITALS — BP 130/84 | HR 72 | Temp 98.2°F | Wt 209.0 lb

## 2018-03-10 DIAGNOSIS — E538 Deficiency of other specified B group vitamins: Secondary | ICD-10-CM | POA: Diagnosis not present

## 2018-03-10 DIAGNOSIS — M10071 Idiopathic gout, right ankle and foot: Secondary | ICD-10-CM

## 2018-03-10 MED ORDER — CYANOCOBALAMIN 1000 MCG/ML IJ SOLN
1000.0000 ug | Freq: Once | INTRAMUSCULAR | Status: AC
  Administered 2018-03-11: 1000 ug via INTRAMUSCULAR

## 2018-03-10 NOTE — Patient Instructions (Signed)
Gout Gout is painful swelling that can happen in some of your joints. Gout is a type of arthritis. This condition is caused by having too much uric acid in your body. Uric acid is a chemical that is made when your body breaks down substances called purines. If your body has too much uric acid, sharp crystals can form and build up in your joints. This causes pain and swelling. Gout attacks can happen quickly and be very painful (acute gout). Over time, the attacks can affect more joints and happen more often (chronic gout). Follow these instructions at home: During a Gout Attack  If directed, put ice on the painful area: ? Put ice in a plastic bag. ? Place a towel between your skin and the bag. ? Leave the ice on for 20 minutes, 2-3 times a day.  Rest the joint as much as possible. If the joint is in your leg, you may be given crutches to use.  Raise (elevate) the painful joint above the level of your heart as often as you can.  Drink enough fluids to keep your pee (urine) clear or pale yellow.  Take over-the-counter and prescription medicines only as told by your doctor.  Do not drive or use heavy machinery while taking prescription pain medicine.  Follow instructions from your doctor about what you can or cannot eat and drink.  Return to your normal activities as told by your doctor. Ask your doctor what activities are safe for you. Avoiding Future Gout Attacks  Follow a low-purine diet as told by a specialist (dietitian) or your doctor. Avoid foods and drinks that have a lot of purines, such as: ? Liver. ? Kidney. ? Anchovies. ? Asparagus. ? Herring. ? Mushrooms ? Mussels. ? Beer.  Limit alcohol intake to no more than 1 drink a day for nonpregnant women and 2 drinks a day for men. One drink equals 12 oz of beer, 5 oz of wine, or 1 oz of hard liquor.  Stay at a healthy weight or lose weight if you are overweight. If you want to lose weight, talk with your doctor. It is  important that you do not lose weight too fast.  Start or continue an exercise plan as told by your doctor.  Drink enough fluids to keep your pee clear or pale yellow.  Take over-the-counter and prescription medicines only as told by your doctor.  Keep all follow-up visits as told by your doctor. This is important. Contact a doctor if:  You have another gout attack.  You still have symptoms of a gout attack after10 days of treatment.  You have problems (side effects) because of your medicines.  You have chills or a fever.  You have burning pain when you pee (urinate).  You have pain in your lower back or belly. Get help right away if:  You have very bad pain.  Your pain cannot be controlled.  You cannot pee. This information is not intended to replace advice given to you by your health care provider. Make sure you discuss any questions you have with your health care provider. Document Released: 01/30/2008 Document Revised: 09/28/2015 Document Reviewed: 02/02/2015 Elsevier Interactive Patient Education  2018 [place] are compounds that affect the level of uric acid in your body. A low-purine diet is a diet that is low in purines. Eating a low-purine diet can prevent the level of uric acid in your body from getting too high and causing gout or kidney stones  or both. What do I need to know about this diet?  Choose low-purine foods. Examples of low-purine foods are listed in the next section.  Drink plenty of fluids, especially water. Fluids can help remove uric acid from your body. Try to drink 8-16 cups (1.9-3.8 L) a day.  Limit foods high in fat, especially saturated fat, as fat makes it harder for the body to get rid of uric acid. Foods high in saturated fat include pizza, cheese, ice cream, whole milk, fried foods, and gravies. Choose foods that are lower in fat and lean sources of protein. Use olive oil when cooking as it contains healthy fats  that are not high in saturated fat.  Limit alcohol. Alcohol interferes with the elimination of uric acid from your body. If you are having a gout attack, avoid all alcohol.  Keep in mind that different people's bodies react differently to different foods. You will probably learn over time which foods do or do not affect you. If you discover that a food tends to cause your gout to flare up, avoid eating that food. You can more freely enjoy foods that do not cause problems. If you have any questions about a food item, talk to your dietitian or health care provider. Which foods are low, moderate, and high in purines? The following is a list of foods that are low, moderate, and high in purines. You can eat any amount of the foods that are low in purines. You may be able to have small amounts of foods that are moderate in purines. Ask your health care provider how much of a food moderate in purines you can have. Avoid foods high in purines. Grains  Foods low in purines: Enriched white bread, pasta, rice, cake, cornbread, popcorn.  Foods moderate in purines: Whole-grain breads and cereals, wheat germ, bran, oatmeal. Uncooked oatmeal. Dry wheat bran or wheat germ.  Foods high in purines: Pancakes, [place] toast, biscuits, muffins. Vegetables  Foods low in purines: All vegetables, except those that are moderate in purines.  Foods moderate in purines: Asparagus, cauliflower, spinach, mushrooms, green peas. Fruits  All fruits are low in purines. Meats and other Protein Foods  Foods low in purines: Eggs, nuts, peanut butter.  Foods moderate in purines: 80-90% lean beef, lamb, veal, pork, poultry, fish, eggs, peanut butter, nuts. Crab, lobster, oysters, and shrimp. Cooked dried beans, peas, and lentils.  Foods high in purines: Anchovies, sardines, herring, mussels, tuna, codfish, scallops, trout, and haddock. Berniece Salines. Organ meats (such as liver or kidney). Tripe. Game meat. Goose.  Sweetbreads. Dairy  All dairy foods are low in purines. Low-fat and fat-free dairy products are best because they are low in saturated fat. Beverages  Drinks low in purines: Water, carbonated beverages, tea, coffee, cocoa.  Drinks moderate in purines: Soft drinks and other drinks sweetened with high-fructose corn syrup. Juices. To find whether a food or drink is sweetened with high-fructose corn syrup, look at the ingredients list.  Drinks high in purines: Alcoholic beverages (such as beer). Condiments  Foods low in purines: Salt, herbs, olives, pickles, relishes, vinegar.  Foods moderate in purines: Butter, margarine, oils, mayonnaise. Fats and Oils  Foods low in purines: All types, except gravies and sauces made with meat.  Foods high in purines: Gravies and sauces made with meat. Other Foods  Foods low in purines: Sugars, sweets, gelatin. Cake. Soups made without meat.  Foods moderate in purines: Meat-based or fish-based soups, broths, or bouillons. Foods and drinks sweetened with high-fructose  corn syrup.  Foods high in purines: High-fat desserts (such as ice cream, cookies, cakes, pies, doughnuts, and chocolate). Contact your dietitian for more information on foods that are not listed here. This information is not intended to replace advice given to you by your health care provider. Make sure you discuss any questions you have with your health care provider. Document Released: 08/17/2010 Document Revised: 09/28/2015 Document Reviewed: 03/29/2013 Elsevier Interactive Patient Education  2017 [employer].

## 2018-03-10 NOTE — Progress Notes (Signed)
Subjective:    Patient ID: Samantha Burch, female    DOB: 1955/07/10, 62 y.o.   MRN: [id number]  No chief complaint on file.   HPI Patient was seen today for acute concern.  Pt endorses foot pain and edema x several days.  Pt has tried ice and ibuprofen for her symptoms.  Pt states she is concerned that she will get kidney stones as several of her family members have gout and kidney stones.  Pt ask if she needs to see a specialist.  Pt requesting high dose influenza vaccine and vit B12 injection.  Pt brought in her own B12.  During OFV pt becomes upset.  States she cannot afford to spend extra money on certain foods and she knows what she needs to work on.  States her son has celiac's dz.  Pt states it is difficult for her to come out of the house d/t her agoraphobia.  Pt accuses this provider of laughing at her.  Pt advised this provider did not laugh at her nor was she not taking her concerns seriously.  Pt became increasingly upset and began yelling.  Pt advised to find another provider.  Pt asked to speak with the office manager.    Past Medical History:  Diagnosis Date  . Anxiety   . Depression   . GERD (gastroesophageal reflux disease)   . Gout   . Hyperlipidemia   . Hypoglycemia   . Hypothyroidism   . Neurogenic bladder   . Neuropathy   . Urinary incontinence     Allergies  Allergen Reactions  . Sulfur     ROS General: Denies fever, chills, night sweats, changes in weight, changes in appetite HEENT: Denies headaches, ear pain, changes in vision, rhinorrhea, sore throat CV: Denies CP, palpitations, SOB, orthopnea Pulm: Denies SOB, cough, wheezing GI: Denies abdominal pain, nausea, vomiting, diarrhea, constipation GU: Denies dysuria, hematuria, frequency, vaginal discharge Msk: Denies muscle cramps, joint pains   +b/l foot edema and pain Neuro: Denies weakness, numbness, tingling Skin: Denies rashes, bruising Psych: Denies depression, hallucinations  +anxiety     Objective:    Blood pressure 130/84, pulse 72, temperature 98.2 F (36.8 C), temperature source Oral, weight 209 lb (94.8 kg), last menstrual period 05/06/2006, SpO2 97 %.  Gen: well-nourished, in no distress, normal affect, upset. Lungs: no accessory muscle use Cardiovascular: RRR Musculoskeletal: R foot with edema and mild erythema.  L foot with faint cyanosis.  No deformities, no or clubbing, normal tone Neuro:  A&Ox3, CN II-XII intact, normal gait  Wt Readings from Last 3 Encounters:  03/10/18 209 lb (94.8 kg)  01/12/18 207 lb (93.9 kg)  12/31/17 209 lb 4 oz (94.9 kg)    Lab Results  Component Value Date   WBC 8.9 10/03/2017   HGB 13.9 10/03/2017   HCT 40.5 10/03/2017   PLT 190.0 10/03/2017   GLUCOSE 77 10/03/2017   ALT 27 01/13/2017   AST 28 01/13/2017   NA 140 10/03/2017   K 3.8 10/03/2017   CL 100 10/03/2017   CREATININE 0.75 10/03/2017   BUN 9 10/03/2017   CO2 31 10/03/2017   TSH 1.68 10/03/2017    Assessment/Plan:  Acute idiopathic gout of right foot -given duration of flare pt offered prednisone, however declined. -advised to continue NASAIDs and ice. -discussed the possibility of starting a ppx medication.  B12 deficiency  -pt was due for B12 injection but left prior to receiving dose. - Plan: cyanocobalamin ((VITAMIN B-12)) injection 1,000 mcg  Pt advised to find a new provider.  Pt spoke with office manager.  Grier Mitts, MD

## 2018-03-11 ENCOUNTER — Encounter: Payer: Self-pay | Admitting: Family Medicine

## 2018-03-11 ENCOUNTER — Ambulatory Visit (INDEPENDENT_AMBULATORY_CARE_PROVIDER_SITE_OTHER): Payer: Medicare Other | Admitting: Family Medicine

## 2018-03-11 VITALS — BP 142/80 | HR 81 | Temp 98.2°F | Wt 206.4 lb

## 2018-03-11 DIAGNOSIS — E538 Deficiency of other specified B group vitamins: Secondary | ICD-10-CM

## 2018-03-11 DIAGNOSIS — M109 Gout, unspecified: Secondary | ICD-10-CM

## 2018-03-11 DIAGNOSIS — Z23 Encounter for immunization: Secondary | ICD-10-CM | POA: Diagnosis not present

## 2018-03-11 MED ORDER — METHYLPREDNISOLONE ACETATE 80 MG/ML IJ SUSP
80.0000 mg | Freq: Once | INTRAMUSCULAR | Status: AC
  Administered 2018-03-11: 80 mg via INTRAMUSCULAR

## 2018-03-11 MED ORDER — KETOROLAC TROMETHAMINE 30 MG/ML IJ SOLN: 30.0000 mg | Freq: Once | INTRAMUSCULAR | Status: DC

## 2018-03-11 MED ORDER — KETOROLAC TROMETHAMINE 60 MG/2ML IM SOLN
60.0000 mg | Freq: Once | INTRAMUSCULAR | Status: AC
  Administered 2018-03-11: 30 mg via INTRAMUSCULAR

## 2018-03-11 NOTE — Progress Notes (Signed)
[name] DOB: 03-17-1956 Encounter date: 03/11/2018  This is a 62 y.o. female who presents with Chief Complaint  Patient presents with  . Gout    x 5 years, started having flair ups more frequently, pt states she is not currently on any medication for gout    History of present illness:  Much better today. Was so swollen it felt like skin was going to rip. Started a week before Halloween. Got so bad she couldn't get around house. Doesn't like prednisone but will take it if possible. Last time she was prescribed prednisone she felt very hyper and nervous coming off of medication, but it did work well previously to take down swelling.   Coloring in toes has changed and she does wory about this. Does have peripheral neuropathy and sees neuro for this. Worries about circulation being an issue.   Gout flares started about 5 years ago. Has had a few falls and other foot issues (some secondary to neuropathy). In last year has had 3-4 bouts of gout. Last was just 2 months ago. Has been trying to work on dietary triggers and does not feel like this is her fault. She does have a strong family history of gout.  Has taken advil for pain otc. Pain level today is 4-5/10. Was a little better from pain standpoint yesterday but being out and walking on foot has aggravated it some.  Allergies  Allergen Reactions  . Sulfur    Current Meds  Medication Sig  . busPIRone (BUSPAR) 15 MG tablet Take 1 tablet (15 mg total) by mouth 2 (two) times daily.  . cyanocobalamin (,VITAMIN B-12,) 1000 MCG/ML injection INJECT 1 ML IM ONCE MONTHLY  . fexofenadine (ALLEGRA) 180 MG tablet Take 1 tablet (180 mg total) by mouth daily.  Marland Kitchen levothyroxine (SYNTHROID, LEVOTHROID) 88 MCG tablet Take 1 tablet (88 mcg total) by mouth daily.  Marland Kitchen omeprazole (PRILOSEC) 20 MG capsule TAKE 1 CAPSULE BY MOUTH 2 TIMES DAILY BEFORE A MEAL  . pregabalin (LYRICA) 225 MG capsule Take 1 capsule (225 mg total) by mouth 2 (two) times  daily.  . sertraline (ZOLOFT) 100 MG tablet Take 1.5 tablets (150 mg total) by mouth daily.  Marland Kitchen tolterodine (DETROL) 2 MG tablet Take 1 tablet (2 mg total) by mouth 2 (two) times daily.    Review of Systems  Constitutional: Negative for chills, fatigue and fever.  Respiratory: Negative for cough, chest tightness, shortness of breath and wheezing.   Cardiovascular: Negative for chest pain, palpitations and leg swelling.  Musculoskeletal: Positive for gait problem.       Foot pain; lateral right foot    Objective:  BP (!) 142/80 (BP Location: Left Arm, Patient Position: Sitting, Cuff Size: Normal)   Pulse 81   Temp 98.2 F (36.8 C) (Oral)   Wt 206 lb 6.4 oz (93.6 kg)   LMP 05/06/2006 (Approximate)   BMI 38.37 kg/m   Weight: 206 lb 6.4 oz (93.6 kg)   BP Readings from Last 3 Encounters:  03/11/18 (!) 142/80  03/10/18 130/84  01/12/18 128/74   Wt Readings from Last 3 Encounters:  03/11/18 206 lb 6.4 oz (93.6 kg)  03/10/18 209 lb (94.8 kg)  01/12/18 207 lb (93.9 kg)    Physical Exam  Constitutional: She is oriented to person, place, and time. She appears well-developed and well-nourished. No distress.  Cardiovascular: Normal rate, regular rhythm and normal heart sounds. Exam reveals no friction rub.  No murmur heard. Pulses:  Dorsalis pedis pulses are 2+ on the right side, and 2+ on the left side.  No lower extremity edema  Pulmonary/Chest: Effort normal and breath sounds normal. No respiratory distress. She has no wheezes. She has no rales.  Neurological: She is alert and oriented to person, place, and time.  Skin:  There is erythema and edema right foot with slight warmth. Erythema extends to mid foot and is subtle and irregular. There is tenderness 5th metatarsal head and slight tenderness ball of foot. Edema is 1+.   Psychiatric: Her behavior is normal. Cognition and memory are normal.    Assessment/Plan 1. Acute gout of right foot, unspecified cause Has responded  well in past to steroid; since getting some improvement already will give steroid injection and then if not noting continued improvement can do steroid taper. Could consider high dose anti-inflammatories as well due to some tolerance difficulty with prednisone. Once flare has resolved; can recheck baseline uric acid level (could be falsely low when previously checked during gout flare) and discuss prevention treatment options. - methylPREDNISolone acetate (DEPO-MEDROL) injection 80 mg - ketorolac (TORADOL) injection 60 mg  2. Need for influenza vaccination - Flu Vaccine QUAD 36+ mos IM   3. B12 administered today in office.   Return has appointment scheduled with me.    Micheline Rough, MD

## 2018-03-13 ENCOUNTER — Telehealth: Payer: Self-pay | Admitting: Family Medicine

## 2018-03-13 ENCOUNTER — Other Ambulatory Visit: Payer: Self-pay | Admitting: Family Medicine

## 2018-03-13 MED ORDER — PREDNISONE 20 MG PO TABS: ORAL_TABLET | ORAL | 0 refills | Status: DC

## 2018-03-13 NOTE — Telephone Encounter (Signed)
Patient is aware that Rx has been sent in and stated that she will call back early next week.

## 2018-03-13 NOTE — Telephone Encounter (Signed)
Please advise 

## 2018-03-13 NOTE — Telephone Encounter (Signed)
Copied from [place] [id number]. Topic: Quick Communication - See Telephone Encounter >> Mar 13, 2018 12:21 PM R[place]ford Nail, [place] wrote: CRM for notification. See Telephone encounter for: 03/13/18. Patient calling and states that she had seen Dr Ethlyn Gallery on 03/11/18 for gout. States that she received 2 injections and that they have helped, but the gout has not completely resolved. States that she did not want to take prednisone to start with, but now thinks that she may need to take that. Please advise. [facility] [place], [place]

## 2018-03-13 NOTE — Telephone Encounter (Signed)
Sorry to hear we didn't get improvement we were hoping for! I will send in the prednisone for her. PLEASE have her update me if not noting significant improvement by early next week.

## 2018-03-27 ENCOUNTER — Ambulatory Visit (INDEPENDENT_AMBULATORY_CARE_PROVIDER_SITE_OTHER): Payer: Medicare Other | Admitting: Family Medicine

## 2018-03-27 ENCOUNTER — Encounter: Payer: Self-pay | Admitting: Family Medicine

## 2018-03-27 VITALS — BP 140/68 | HR 79 | Temp 98.1°F | Wt 205.0 lb

## 2018-03-27 DIAGNOSIS — M79671 Pain in right foot: Secondary | ICD-10-CM

## 2018-03-27 DIAGNOSIS — F419 Anxiety disorder, unspecified: Secondary | ICD-10-CM | POA: Diagnosis not present

## 2018-03-27 DIAGNOSIS — F32A Depression, unspecified: Secondary | ICD-10-CM

## 2018-03-27 DIAGNOSIS — Z13220 Encounter for screening for lipoid disorders: Secondary | ICD-10-CM

## 2018-03-27 DIAGNOSIS — Z8639 Personal history of other endocrine, nutritional and metabolic disease: Secondary | ICD-10-CM | POA: Diagnosis not present

## 2018-03-27 DIAGNOSIS — E785 Hyperlipidemia, unspecified: Secondary | ICD-10-CM | POA: Diagnosis not present

## 2018-03-27 DIAGNOSIS — G609 Hereditary and idiopathic neuropathy, unspecified: Secondary | ICD-10-CM

## 2018-03-27 DIAGNOSIS — F329 Major depressive disorder, single episode, unspecified: Secondary | ICD-10-CM
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
## 2018-03-28 ENCOUNTER — Other Ambulatory Visit: Payer: Self-pay | Admitting: Family Medicine

## 2018-03-28 DIAGNOSIS — E785 Hyperlipidemia, unspecified: Secondary | ICD-10-CM

## 2018-03-28 LAB — LIPID PANEL
CHOLESTEROL: 344 mg/dL — AB (ref <200)
HDL: 50 mg/dL — AB (ref >50)
LDL Cholesterol (Calc): 247 mg/dL (calc) — ABNORMAL HIGH
NON-HDL CHOLESTEROL (CALC): 294 mg/dL — AB (ref <130)
TRIGLYCERIDES: 264 mg/dL — AB (ref <150)
Total CHOL/HDL Ratio: 6.9 (calc) — ABNORMAL HIGH (ref <5.0)

## 2018-03-28 MED ORDER — PRAVASTATIN SODIUM 20 MG PO TABS: 20.0000 mg | ORAL_TABLET | Freq: Every day | ORAL | 1 refills | Status: DC

## 2018-04-06 ENCOUNTER — Encounter: Payer: Self-pay | Admitting: Family Medicine

## 2018-04-06 ENCOUNTER — Other Ambulatory Visit: Payer: Self-pay | Admitting: Family Medicine

## 2018-04-06 DIAGNOSIS — M109 Gout, unspecified: Secondary | ICD-10-CM

## 2018-04-06 DIAGNOSIS — R5383 Other fatigue: Secondary | ICD-10-CM

## 2018-04-06 DIAGNOSIS — E538 Deficiency of other specified B group vitamins: Secondary | ICD-10-CM

## 2018-04-06 DIAGNOSIS — E039 Hypothyroidism, unspecified: Secondary | ICD-10-CM

## 2018-04-07 ENCOUNTER — Encounter: Payer: Self-pay | Admitting: Sports Medicine

## 2018-04-07 ENCOUNTER — Ambulatory Visit (INDEPENDENT_AMBULATORY_CARE_PROVIDER_SITE_OTHER): Payer: Medicare Other | Admitting: Sports Medicine

## 2018-04-07 ENCOUNTER — Ambulatory Visit: Payer: Medicare Other

## 2018-04-07 VITALS — BP 130/69 | HR 64 | Resp 16

## 2018-04-07 DIAGNOSIS — M779 Enthesopathy, unspecified: Secondary | ICD-10-CM

## 2018-04-07 DIAGNOSIS — M1A0710 Idiopathic chronic gout, right ankle and foot, without tophus (tophi): Secondary | ICD-10-CM

## 2018-04-07 DIAGNOSIS — M7989 Other specified soft tissue disorders: Secondary | ICD-10-CM | POA: Diagnosis not present

## 2018-04-07 DIAGNOSIS — M778 Other enthesopathies, not elsewhere classified: Secondary | ICD-10-CM

## 2018-04-07 DIAGNOSIS — G609 Hereditary and idiopathic neuropathy, unspecified: Secondary | ICD-10-CM

## 2018-04-07 DIAGNOSIS — M79671 Pain in right foot: Secondary | ICD-10-CM | POA: Diagnosis not present

## 2018-04-07 DIAGNOSIS — I739 Peripheral vascular disease, unspecified: Secondary | ICD-10-CM

## 2018-04-07 MED ORDER — TRIAMCINOLONE ACETONIDE 10 MG/ML IJ SUSP
10.0000 mg | Freq: Once | INTRAMUSCULAR | Status: AC
  Administered 2018-04-07: 10 mg

## 2018-04-07 NOTE — Progress Notes (Signed)
Subjective: Pixie Burgener is a 62 y.o. female patient who presents to office for evaluation of Right foot pain. Patient pain over the last 3 months and swelling over the entire foot states that gradually it has gotten worse initially her doctor thought it was gout because on the side of her foot and ankle her foot became very red and swollen and difficult to touch states that her uric acid done back in August was 6.5 and she completed a round of prednisone but the pain and swelling still came back now states that the pain is swelling is located in the ball of her right foot and no longer at the side or ankle on the right.  Patient denies any injury or trauma. Patient denies any other pedal complaints. Denies injury/trip/fall/sprain/any causative factors.   Patient does admit to a history of peripheral neuropathy and reports that she did have x-rays of her right foot back in August and does not want new x-rays at this time.  Patient Active Problem List   Diagnosis Date Noted  . Idiopathic peripheral neuropathy 09/19/2016  . Depression 09/19/2016  . Anxiety 09/19/2016  . Neurogenic bladder 09/19/2016    Current Outpatient Medications on File Prior to Visit  Medication Sig Dispense Refill  . busPIRone (BUSPAR) 15 MG tablet Take 1 tablet (15 mg total) by mouth 2 (two) times daily. 180 tablet 1  . cyanocobalamin (,VITAMIN B-12,) 1000 MCG/ML injection INJECT 1 ML IM ONCE MONTHLY 10 mL 2  . fexofenadine (ALLEGRA) 180 MG tablet Take 1 tablet (180 mg total) by mouth daily. 90 tablet 1  . levothyroxine (SYNTHROID, LEVOTHROID) 88 MCG tablet Take 1 tablet (88 mcg total) by mouth daily. 90 tablet 1  . omeprazole (PRILOSEC) 20 MG capsule TAKE 1 CAPSULE BY MOUTH 2 TIMES DAILY BEFORE A MEAL 180 capsule 0  . pravastatin (PRAVACHOL) 20 MG tablet Take 1 tablet (20 mg total) by mouth daily. 90 tablet 1  . predniSONE (DELTASONE) 20 MG tablet Take 2 tabs daily x 2 days, 1 tab daily x 3 days, 1/2 tab daily x 2  days 8 tablet 0  . pregabalin (LYRICA) 225 MG capsule Take 1 capsule (225 mg total) by mouth 2 (two) times daily. 180 capsule 3  . sertraline (ZOLOFT) 100 MG tablet Take 1.5 tablets (150 mg total) by mouth daily. 135 tablet 2  . tolterodine (DETROL) 2 MG tablet Take 1 tablet (2 mg total) by mouth 2 (two) times daily. 180 tablet 2   No current facility-administered medications on file prior to visit.     Allergies  Allergen Reactions  . Sulfur     Objective:  General: Alert and oriented x3 in no acute distress  Dermatology: No open lesions bilateral lower extremities, no webspace macerations, no ecchymosis bilateral, all nails x 10 are well manicured.  Vascular: Dorsalis Pedis and Posterior Tibial pedal pulses palpable, Capillary Fill Time 3 seconds,(+) pedal hair growth bilateral, Temperature gradient within normal limits.  There is trace edema located bilateral lower extremities.  Neurology: Johney Maine sensation intact via light touch bilateral.  Musculoskeletal: Mild tenderness with palpation at the ball of the right foot and sub-second and third metatarsals,No pain with calf compression bilateral. Strength within normal limits in all groups bilateral.   Gait: Antalgic gait  Xrays  Right foot from August reviewed revealing no fracture or dislocation mild soft tissue swelling but uniquely there is squaring of the second and third metatarsal heads.  Assessment and Plan: Problem List Items Addressed  This Visit      Nervous and Auditory   Idiopathic peripheral neuropathy    Other Visit Diagnoses    Capsulitis of foot, right    -  Primary   Relevant Medications   triamcinolone acetonide (KENALOG) 10 MG/ML injection 10 mg (Completed)   Swelling of right foot       Chronic gout of right foot, unspecified cause       Relevant Medications   triamcinolone acetonide (KENALOG) 10 MG/ML injection 10 mg (Completed)   Other Relevant Orders   ANA, IFA Comprehensive Panel   C-reactive  protein   Rheumatoid factor   Sedimentation rate   Uric acid   CBC with Differential   Basic Metabolic Panel   PVD (peripheral vascular disease) (HCC)       Right foot pain           -Complete examination performed -Xrays reviewed -Discussed treatement options for capsulitis versus chronic inflammatory joint conditions like gout or things that can cause erosions or changes at the metatarsal head with swelling -Rx venous reflux studies and arthritic panel for further evaluation -Dispensed metatarsal padding to use bilateral and recommended good supportive shoes -After oral consent and aseptic prep, injected a mixture containing 1 ml of 2%  plain lidocaine, 1 ml 0.5% plain marcaine, 0.5 ml of kenalog 10 and 0.5 ml of dexamethasone phosphate into plantar forefoot at the second and third metatarsal heads via a plantar approach without complication. Post-injection care discussed with patient.  -Patient to return to office after blood work and after venous reflux testing or sooner if condition worsens.  Landis Martins, DPM

## 2018-04-08 ENCOUNTER — Telehealth: Payer: Self-pay | Admitting: *Deleted

## 2018-04-08 ENCOUNTER — Telehealth (HOSPITAL_COMMUNITY): Payer: Self-pay | Admitting: *Deleted

## 2018-04-08 DIAGNOSIS — I739 Peripheral vascular disease, unspecified: Secondary | ICD-10-CM

## 2018-04-08 DIAGNOSIS — M7989 Other specified soft tissue disorders: Secondary | ICD-10-CM

## 2018-04-08 DIAGNOSIS — M779 Enthesopathy, unspecified: Principal | ICD-10-CM

## 2018-04-08 DIAGNOSIS — M778 Other enthesopathies, not elsewhere classified: Secondary | ICD-10-CM

## 2018-04-08 NOTE — Telephone Encounter (Signed)
Faxed orders to [facility]. 

## 2018-04-08 NOTE — Telephone Encounter (Signed)
04/08/2018 left msg asking pt to call in reference to scheduling appointment request from Dr. Wallie Char

## 2018-04-08 NOTE — Telephone Encounter (Signed)
-----   Message from Landis Martins, [place] sent at 04/07/2018  4:23 PM EST ----- Regarding: Venous reflux  Bil R>L swelling venous reflux for swelling

## 2018-04-13 DIAGNOSIS — M1A0710 Idiopathic chronic gout, right ankle and foot, without tophus (tophi): Secondary | ICD-10-CM | POA: Diagnosis not present

## 2018-04-14 ENCOUNTER — Encounter (HOSPITAL_COMMUNITY): Payer: Medicare Other

## 2018-04-15 LAB — CBC WITH DIFFERENTIAL/PLATELET
Basophils Absolute: 83 cells/uL (ref 0–200)
Basophils Relative: 1.1 %
EOS ABS: 120 {cells}/uL (ref 15–500)
Eosinophils Relative: 1.6 %
HCT: 40.8 % (ref 35.0–45.0)
Hemoglobin: 13.8 g/dL (ref 11.7–15.5)
Lymphs Abs: 3593 cells/uL (ref 850–3900)
MCH: 28.6 pg (ref 27.0–33.0)
MCHC: 33.8 g/dL (ref 32.0–36.0)
MCV: 84.6 fL (ref 80.0–100.0)
MPV: 10.4 fL (ref 7.5–12.5)
Monocytes Relative: 8.8 %
Neutro Abs: 3045 cells/uL (ref 1500–7800)
Neutrophils Relative %: 40.6 %
Platelets: 227 10*3/uL (ref 140–400)
RBC: 4.82 10*6/uL (ref 3.80–5.10)
RDW: 13.1 % (ref 11.0–15.0)
Total Lymphocyte: 47.9 %
WBC mixed population: 660 cells/uL (ref 200–950)
WBC: 7.5 10*3/uL (ref 3.8–10.8)

## 2018-04-15 LAB — ANA, IFA COMPREHENSIVE PANEL
Anti Nuclear Antibody(ANA): POSITIVE — AB
DS DNA AB: 1 [IU]/mL
ENA SM Ab Ser-aCnc: <1 AI
SM/RNP: <1 AI
SSA (RO) (ENA) ANTIBODY, IGG: NEGATIVE AI
SSB (LA) (ENA) ANTIBODY, IGG: NEGATIVE AI
Scleroderma (Scl-70) (ENA) Antibody, IgG: <1 AI

## 2018-04-15 LAB — BASIC METABOLIC PANEL
BUN: 10 mg/dL (ref 7–25)
CHLORIDE: 101 mmol/L (ref 98–110)
CO2: 30 mmol/L (ref 20–32)
Calcium: 9.2 mg/dL (ref 8.6–10.4)
Creat: 0.75 mg/dL (ref 0.50–0.99)
Glucose, Bld: 88 mg/dL (ref 65–139)
Potassium: 3.9 mmol/L (ref 3.5–5.3)
Sodium: 141 mmol/L (ref 135–146)

## 2018-04-15 LAB — ANTI-NUCLEAR AB-TITER (ANA TITER): ANA Titer 1: 1:40 {titer} — ABNORMAL HIGH

## 2018-04-15 LAB — SEDIMENTATION RATE: SED RATE: 22 mm/h (ref 0–30)

## 2018-04-15 LAB — RHEUMATOID FACTOR

## 2018-04-15 LAB — URIC ACID: Uric Acid, Serum: 8.8 mg/dL — ABNORMAL HIGH (ref 2.5–7.0)

## 2018-04-15 LAB — C-REACTIVE PROTEIN: CRP: 1.8 mg/L (ref <8.0)

## 2018-04-17 ENCOUNTER — Telehealth: Payer: Self-pay | Admitting: *Deleted

## 2018-04-17 NOTE — Telephone Encounter (Signed)
-----   Message from Landis Martins, [place] sent at 04/15/2018  1:13 PM EST ----- Contact patient to let her know that her arthritic panel came back positive and the next step is for [place] to refer her to rheumatology for further recommendations on treatment _Dr. Cannon Kettle

## 2018-04-17 NOTE — Telephone Encounter (Signed)
Left message requesting call to discuss results and orders.

## 2018-04-20 ENCOUNTER — Ambulatory Visit (HOSPITAL_COMMUNITY)
Admission: RE | Admit: 2018-04-20 | Discharge: 2018-04-20 | Disposition: A | Discharge status: Home / Self Care | Payer: Medicare Other | Source: Ambulatory Visit | Attending: Vascular Surgery | Admitting: Vascular Surgery

## 2018-04-20 DIAGNOSIS — M7989 Other specified soft tissue disorders: Secondary | ICD-10-CM

## 2018-04-20 DIAGNOSIS — I739 Peripheral vascular disease, unspecified: Secondary | ICD-10-CM

## 2018-04-21 ENCOUNTER — Ambulatory Visit (INDEPENDENT_AMBULATORY_CARE_PROVIDER_SITE_OTHER): Payer: Medicare Other | Admitting: Sports Medicine

## 2018-04-21 ENCOUNTER — Telehealth: Payer: Self-pay | Admitting: *Deleted

## 2018-04-21 ENCOUNTER — Encounter: Payer: Self-pay | Admitting: Sports Medicine

## 2018-04-21 ENCOUNTER — Ambulatory Visit (INDEPENDENT_AMBULATORY_CARE_PROVIDER_SITE_OTHER): Payer: Medicare Other

## 2018-04-21 DIAGNOSIS — M7752 Other enthesopathy of left foot: Secondary | ICD-10-CM

## 2018-04-21 DIAGNOSIS — M10072 Idiopathic gout, left ankle and foot: Secondary | ICD-10-CM

## 2018-04-21 DIAGNOSIS — M779 Enthesopathy, unspecified: Principal | ICD-10-CM

## 2018-04-21 DIAGNOSIS — M778 Other enthesopathies, not elsewhere classified: Secondary | ICD-10-CM

## 2018-04-21 MED ORDER — COLCHICINE 0.6 MG PO TABS: 0.6000 mg | ORAL_TABLET | Freq: Every day | ORAL | 0 refills | Status: DC

## 2018-04-21 NOTE — Telephone Encounter (Signed)
Pt called for results. I informed pt of Dr. Leeanne Rio review of results and orders, that would be faxed to [facility]. Pt states she is having a lot of pain and swelling in her foot and thinks she has gout. Transferred to scheduler for an appt.

## 2018-04-21 NOTE — Telephone Encounter (Signed)
-----   Message from [place], [place] sent at 04/20/2018  6:52 PM EST ----- Regarding: Venous Reflux Results There is reflux bilateral. Patient will benefit from getting compression garments knee high 81mmgh and should wear them daily to help with swelling bilateral. Thanks  Dr. Cannon Kettle

## 2018-04-21 NOTE — Progress Notes (Signed)
Subjective: Samantha Burch is a 62 y.o. female patient who returns to office for follow-up evaluation of foot pain.  Patient reports that she has no significant pain on the right and little soreness but now her left foot has flared up where it is red warm swollen and she is wondering if the gout has moved to this foot as well.  Patient reports that she was given a phone call today by our office nurse who informed her of her circulation test which revealed reflux and that she will be getting compression garments however did want me to check her left foot because this has acutely swollen up.  Patient states that she has tried rest ice elevation but swelling continues and she is concerned about the warmth and redness and the pain that is associated with this foot.  Patient reports that pain is present even with light touch and even when she is not on her foot.  Patient denies any other changes with medical history since last visit.  Patient Active Problem List   Diagnosis Date Noted  . Idiopathic peripheral neuropathy 09/19/2016  . Depression 09/19/2016  . Anxiety 09/19/2016  . Neurogenic bladder 09/19/2016    Current Outpatient Medications on File Prior to Visit  Medication Sig Dispense Refill  . busPIRone (BUSPAR) 15 MG tablet Take 1 tablet (15 mg total) by mouth 2 (two) times daily. 180 tablet 1  . cyanocobalamin (,VITAMIN B-12,) 1000 MCG/ML injection INJECT 1 ML IM ONCE MONTHLY 10 mL 2  . fexofenadine (ALLEGRA) 180 MG tablet Take 1 tablet (180 mg total) by mouth daily. 90 tablet 1  . levothyroxine (SYNTHROID, LEVOTHROID) 88 MCG tablet Take 1 tablet (88 mcg total) by mouth daily. 90 tablet 1  . omeprazole (PRILOSEC) 20 MG capsule TAKE 1 CAPSULE BY MOUTH 2 TIMES DAILY BEFORE A MEAL 180 capsule 0  . pravastatin (PRAVACHOL) 20 MG tablet Take 1 tablet (20 mg total) by mouth daily. 90 tablet 1  . predniSONE (DELTASONE) 20 MG tablet Take 2 tabs daily x 2 days, 1 tab daily x 3 days, 1/2 tab daily x  2 days 8 tablet 0  . pregabalin (LYRICA) 225 MG capsule Take 1 capsule (225 mg total) by mouth 2 (two) times daily. 180 capsule 3  . sertraline (ZOLOFT) 100 MG tablet Take 1.5 tablets (150 mg total) by mouth daily. 135 tablet 2  . tolterodine (DETROL) 2 MG tablet Take 1 tablet (2 mg total) by mouth 2 (two) times daily. 180 tablet 2   No current facility-administered medications on file prior to visit.     Allergies  Allergen Reactions  . Sulfur     Objective:  General: Alert and oriented x3 in no acute distress  Dermatology: Soft tissue swelling redness and warmth focal to the left forefoot, no open lesions bilateral lower extremities, no webspace macerations, no ecchymosis bilateral, all nails x 10 are well manicured.  Vascular: Dorsalis Pedis and Posterior Tibial pedal pulses palpable, Capillary Fill Time 3 seconds,(+) pedal hair growth bilateral, Temperature gradient within normal limits.  There is trace edema located bilateral lower extremities.  Neurology: Johney Maine sensation intact via light touch bilateral.  Musculoskeletal: Mild tenderness with palpation at the left foot diffusely but pain intensifies at the first metatarsophalangeal joint, no reproducible pain on the right subjective soreness but no actual pain with palpation,No pain with calf compression bilateral. Strength within normal limits in all groups bilateral.   Gait: Antalgic gait  Xrays  Left foot reveals soft tissue  swelling there is mild osteophyte noted at the medial aspect of the first metatarsophalangeal joint on the left no other acute findings.    Assessment and Plan: Problem List Items Addressed This Visit    None    Visit Diagnoses    Acute idiopathic gout of left foot    -  Primary   Relevant Medications   colchicine 0.6 MG tablet   Other Relevant Orders   DG Foot Complete Left (Completed)      -Complete examination performed -Xrays reviewed -Discussed treatement options for capsulitis versus  chronic inflammatory joint conditions like gout -Patient declined injection today on left foot and opts for colchicine orally -Patient to follow-up with rheumatologist for positive arthritic panel -Advised patient since her venous reflux testing is positive to consider to get compression garments to assist with edema control bilateral -Patient to return to office for follow-up evaluation after seen by rheumatology or sooner if problems or issues arise  Landis Martins, DPM

## 2018-04-21 NOTE — Telephone Encounter (Signed)
Left message requesting a call back to discuss circulation results.

## 2018-04-21 NOTE — Telephone Encounter (Signed)
-----   Message from Landis Martins, [place] sent at 04/15/2018  1:13 PM EST ----- Contact patient to let her know that her arthritic panel came back positive and the next step is for [place] to refer her to rheumatology for further recommendations on treatment _Dr. Cannon Kettle

## 2018-04-22 ENCOUNTER — Telehealth: Payer: Self-pay | Admitting: *Deleted

## 2018-04-22 DIAGNOSIS — M7989 Other specified soft tissue disorders: Secondary | ICD-10-CM

## 2018-04-22 DIAGNOSIS — M778 Other enthesopathies, not elsewhere classified: Secondary | ICD-10-CM

## 2018-04-22 DIAGNOSIS — M10072 Idiopathic gout, left ankle and foot: Secondary | ICD-10-CM

## 2018-04-22 DIAGNOSIS — M779 Enthesopathy, unspecified: Secondary | ICD-10-CM

## 2018-04-22 NOTE — Telephone Encounter (Signed)
I informed pt of Dr. Leeanne Rio review of lab results and would refer to [facility] [phone]. Pt states she was given 7 days of Colcrys and is also on Pravastatin, there was a caution with the two medications possible muscle complications. Pt states she would like Dr. Leeanne Rio opinion on taking the Colcrys short-term with Pravastatin.

## 2018-04-22 NOTE — Telephone Encounter (Signed)
-----   Message from Landis Martins, [place] sent at 04/21/2018 10:35 PM EST ----- Regarding: Rheumatology referral Positive arthritic panel make sure patient gets rheumatology follow-up

## 2018-04-22 NOTE — Telephone Encounter (Signed)
She will be fine to take Colchicine since its only for 7 days. If there is muscle pain or cramping happening then discontinue Thanks Dr.Jasmeen Fritsch

## 2018-04-22 NOTE — Telephone Encounter (Signed)
Faxed required form, clinicals and demographics to [facility]. 

## 2018-04-22 NOTE — Telephone Encounter (Signed)
I informed pt of Dr. Leeanne Rio orders of 04/22/2018 10:09am. Pt states understanding.

## 2018-05-14 DIAGNOSIS — M1A09X0 Idiopathic chronic gout, multiple sites, without tophus (tophi): Secondary | ICD-10-CM | POA: Diagnosis not present

## 2018-05-14 DIAGNOSIS — M2550 Pain in unspecified joint: Secondary | ICD-10-CM | POA: Diagnosis not present

## 2018-05-14 DIAGNOSIS — Z6839 Body mass index (BMI) 39.0-39.9, adult: Secondary | ICD-10-CM | POA: Diagnosis not present

## 2018-05-14 DIAGNOSIS — E669 Obesity, unspecified: Secondary | ICD-10-CM | POA: Diagnosis not present

## 2018-06-18 ENCOUNTER — Telehealth: Payer: Self-pay | Admitting: *Deleted

## 2018-06-18 ENCOUNTER — Other Ambulatory Visit: Payer: Self-pay | Admitting: Family Medicine

## 2018-06-18 DIAGNOSIS — E039 Hypothyroidism, unspecified: Secondary | ICD-10-CM

## 2018-06-18 DIAGNOSIS — M2550 Pain in unspecified joint: Secondary | ICD-10-CM | POA: Diagnosis not present

## 2018-06-18 DIAGNOSIS — Z6838 Body mass index (BMI) 38.0-38.9, adult: Secondary | ICD-10-CM | POA: Diagnosis not present

## 2018-06-18 DIAGNOSIS — M1A09X0 Idiopathic chronic gout, multiple sites, without tophus (tophi): Secondary | ICD-10-CM | POA: Diagnosis not present

## 2018-06-18 DIAGNOSIS — E669 Obesity, unspecified: Secondary | ICD-10-CM | POA: Diagnosis not present

## 2018-06-18 NOTE — Telephone Encounter (Signed)
This is Dr Ethlyn Gallery pt

## 2018-06-18 NOTE — Telephone Encounter (Signed)
-----   Message from Samantha Burch, [place] sent at 06/15/2018  8:55 PM EST ----- Reflux test positive for swelling secondary to leaking valves in her deep veins Treatment is compression stocking recommend at least 58mmHg to wear during the day. May take off for shower and bed -Dr. Cannon Kettle

## 2018-06-18 NOTE — Telephone Encounter (Signed)
Left message for pt to call for results  

## 2018-06-19 NOTE — Telephone Encounter (Signed)
-----   Message from Landis Martins, [place] sent at 06/15/2018  8:55 PM EST ----- Reflux test positive for swelling secondary to leaking valves in her deep veins Treatment is compression stocking recommend at least 52mmHg to wear during the day. May take off for shower and bed -Dr. Cannon Kettle

## 2018-06-22 ENCOUNTER — Telehealth: Payer: Self-pay | Admitting: *Deleted

## 2018-06-22 ENCOUNTER — Encounter: Payer: Self-pay | Admitting: *Deleted

## 2018-06-22 NOTE — Telephone Encounter (Signed)
Entered in error

## 2018-06-22 NOTE — Telephone Encounter (Deleted)
Mailed letter with Dr. Leeanne Rio 06/15/2018 8:55pm orders.

## 2018-06-22 NOTE — Telephone Encounter (Signed)
-----   Message from Landis Martins, [place] sent at 06/15/2018  8:55 PM EST ----- Reflux test positive for swelling secondary to leaking valves in her deep veins Treatment is compression stocking recommend at least 66mmHg to wear during the day. May take off for shower and bed -Dr. Cannon Kettle

## 2018-06-27 ENCOUNTER — Other Ambulatory Visit: Payer: Self-pay | Admitting: Family Medicine

## 2018-06-29 NOTE — Telephone Encounter (Signed)
This is dr Ethlyn Gallery pt

## 2018-07-10 ENCOUNTER — Other Ambulatory Visit: Payer: Self-pay | Admitting: Family Medicine

## 2018-07-10 DIAGNOSIS — F419 Anxiety disorder, unspecified: Principal | ICD-10-CM

## 2018-07-10 DIAGNOSIS — F329 Major depressive disorder, single episode, unspecified: Secondary | ICD-10-CM

## 2018-07-10 NOTE — Telephone Encounter (Signed)
Last fill 10/03/17 by Dr. Volanda Napoleon Last OV 03/27/18  Ok to fill?

## 2018-07-10 NOTE — Telephone Encounter (Signed)
Dr. Koberlein pt 

## 2018-07-17 DIAGNOSIS — M1A09X0 Idiopathic chronic gout, multiple sites, without tophus (tophi): Secondary | ICD-10-CM | POA: Diagnosis not present

## 2018-08-11 DIAGNOSIS — M1A09X0 Idiopathic chronic gout, multiple sites, without tophus (tophi): Secondary | ICD-10-CM | POA: Diagnosis not present

## 2018-08-18 DIAGNOSIS — M1A09X0 Idiopathic chronic gout, multiple sites, without tophus (tophi): Secondary | ICD-10-CM | POA: Diagnosis not present

## 2018-08-18 DIAGNOSIS — M2550 Pain in unspecified joint: Secondary | ICD-10-CM | POA: Diagnosis not present

## 2018-08-18 DIAGNOSIS — R7989 Other specified abnormal findings of blood chemistry: Secondary | ICD-10-CM | POA: Diagnosis not present

## 2018-09-08 IMAGING — DX DG FOOT COMPLETE 3+V*R*
3 series · 3 of 3 positions shown · non-contrast
Comparison: None.

CLINICAL DATA: RIGHT foot tenderness, symptoms 2 weeks.

EXAM:
RIGHT FOOT COMPLETE - 3+ VIEW

[foot ap]
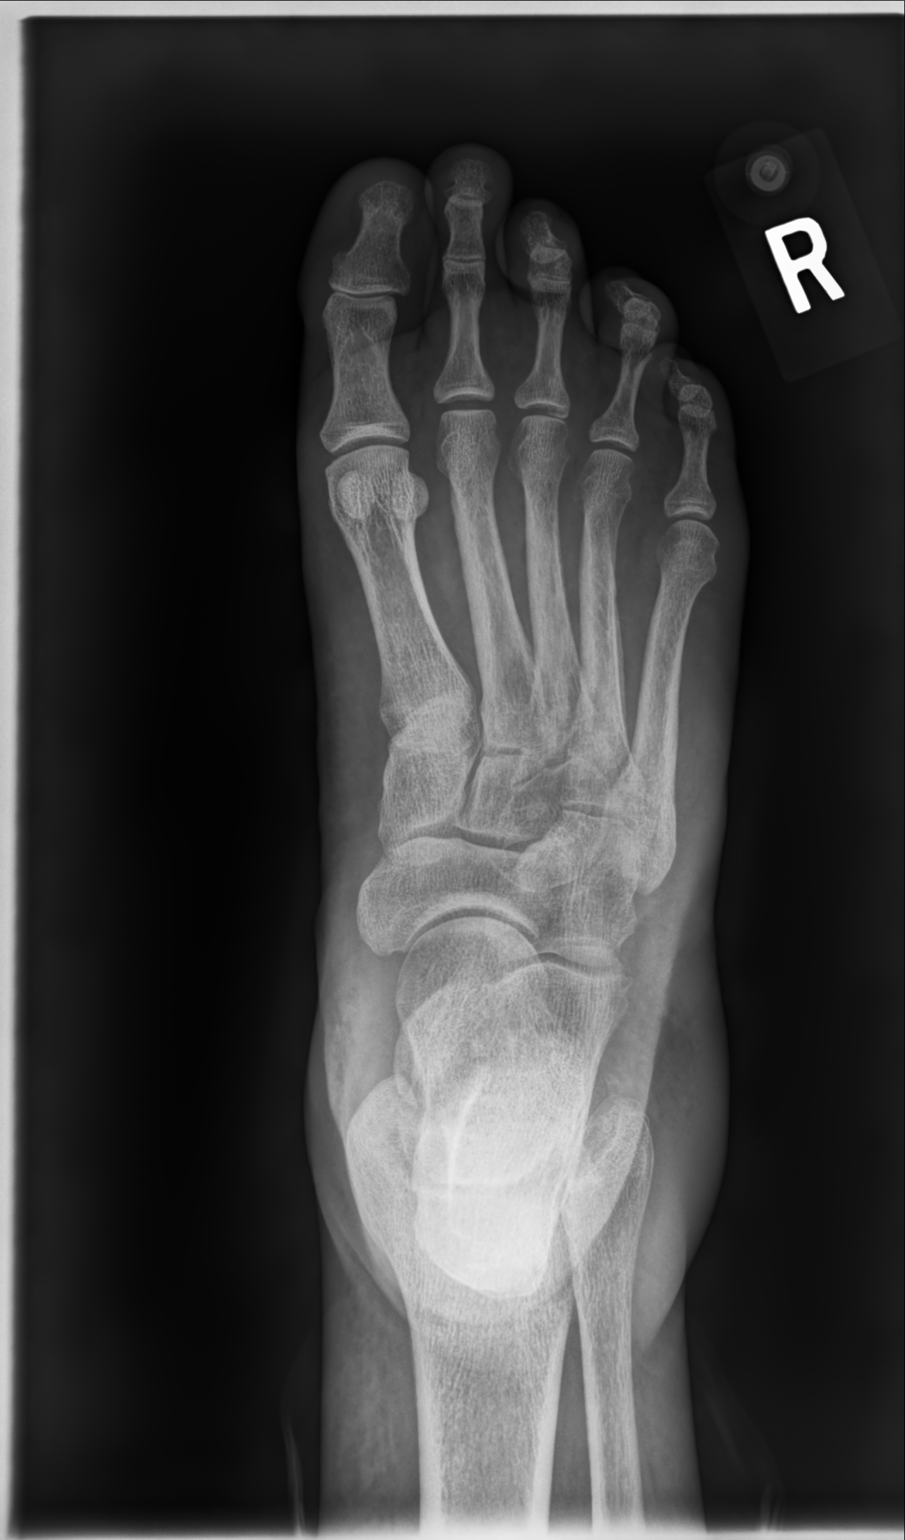

[foot mlo]
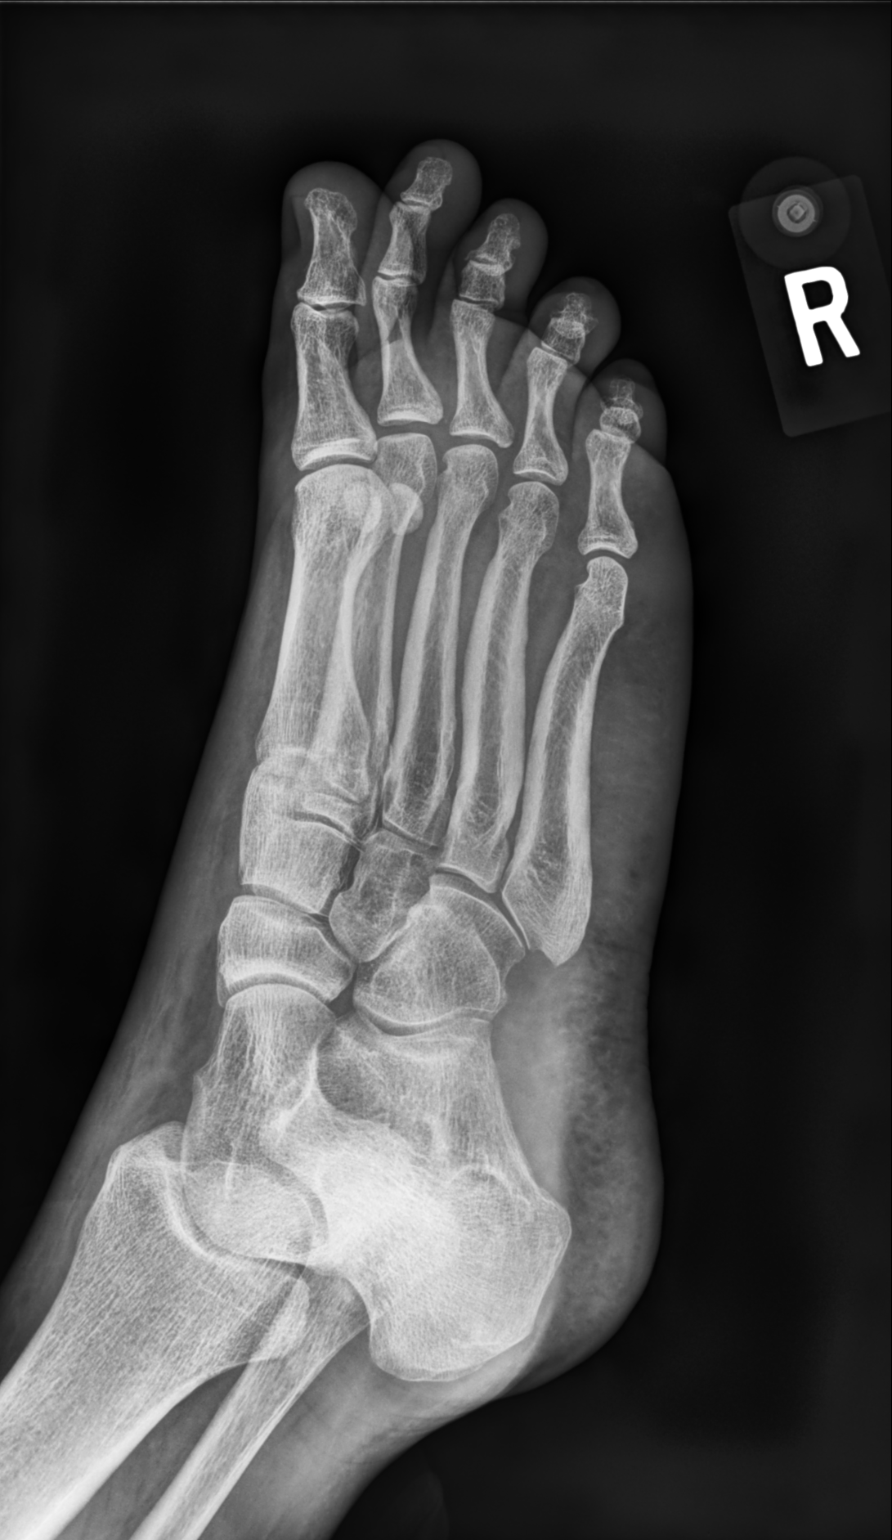

[foot lat]
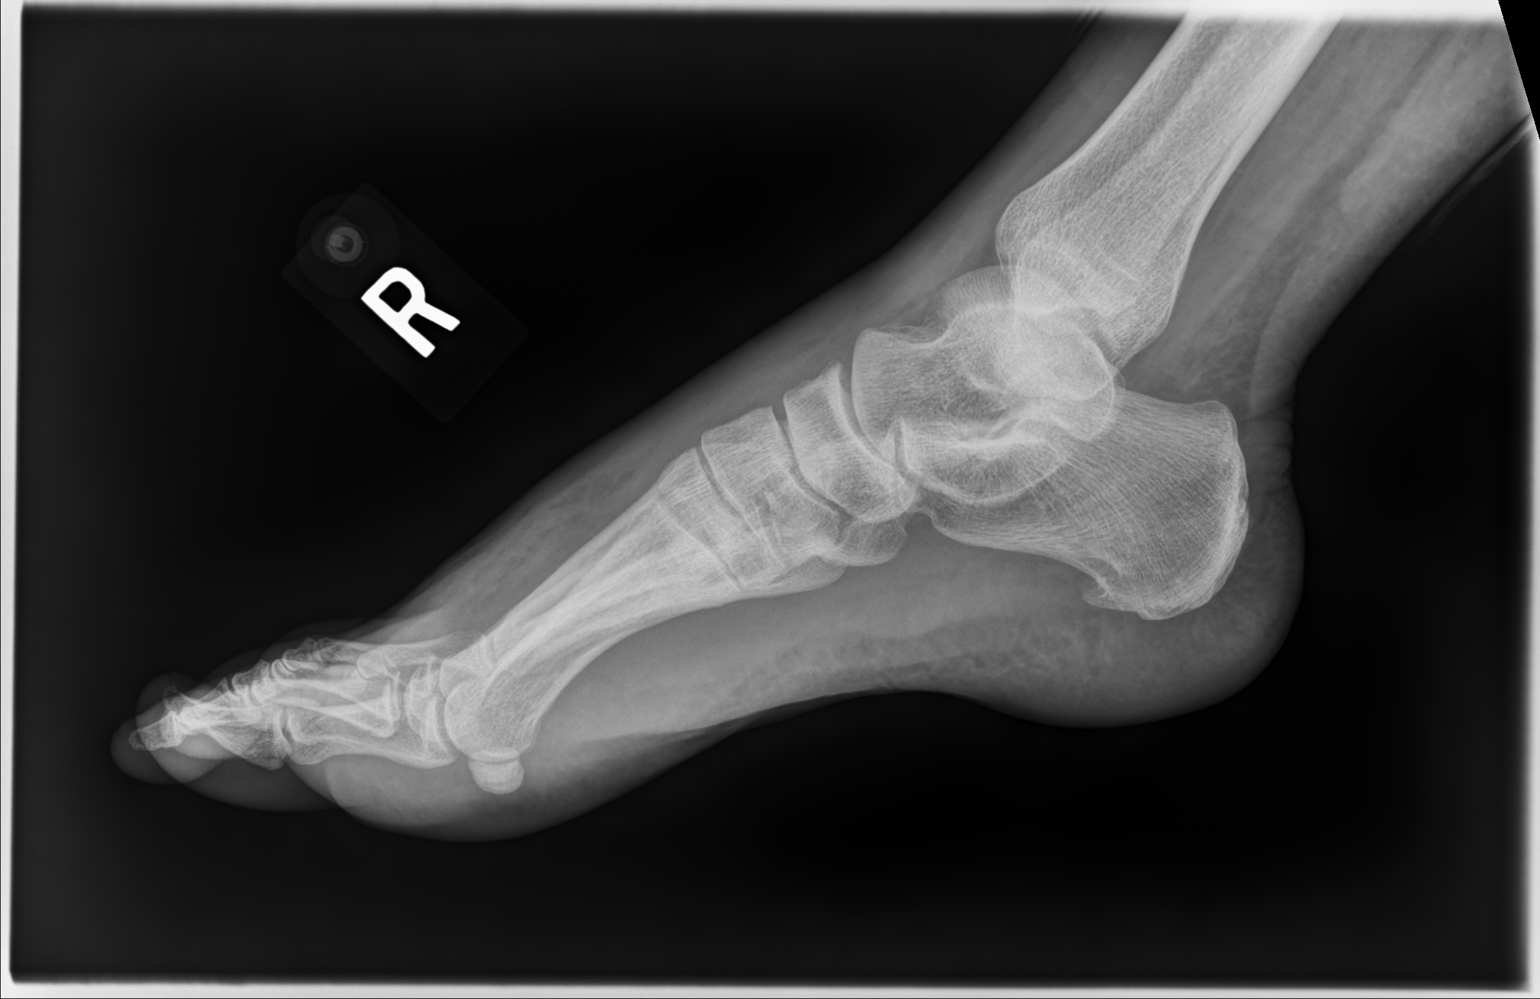

[3 of 3 positions shown; findings below may reference images not displayed]

FINDINGS: No fracture or dislocation.  Soft tissue swelling.  Plantar spur.
IMPRESSION: No osseous findings.  Soft tissue swelling.

## 2018-11-10 ENCOUNTER — Other Ambulatory Visit: Payer: Self-pay | Admitting: Family Medicine

## 2018-11-10 DIAGNOSIS — F329 Major depressive disorder, single episode, unspecified: Secondary | ICD-10-CM

## 2018-11-10 DIAGNOSIS — F419 Anxiety disorder, unspecified: Secondary | ICD-10-CM

## 2019-01-05 DIAGNOSIS — R7989 Other specified abnormal findings of blood chemistry: Secondary | ICD-10-CM | POA: Diagnosis not present

## 2019-01-05 DIAGNOSIS — M2550 Pain in unspecified joint: Secondary | ICD-10-CM | POA: Diagnosis not present

## 2019-01-05 DIAGNOSIS — E669 Obesity, unspecified: Secondary | ICD-10-CM | POA: Diagnosis not present

## 2019-01-05 DIAGNOSIS — M1A09X0 Idiopathic chronic gout, multiple sites, without tophus (tophi): Secondary | ICD-10-CM | POA: Diagnosis not present

## 2019-01-05 DIAGNOSIS — Z6838 Body mass index (BMI) 38.0-38.9, adult: Secondary | ICD-10-CM | POA: Diagnosis not present

## 2019-01-12 ENCOUNTER — Other Ambulatory Visit: Payer: Self-pay

## 2019-01-12 ENCOUNTER — Telehealth (INDEPENDENT_AMBULATORY_CARE_PROVIDER_SITE_OTHER): Payer: Medicare Other | Admitting: Neurology

## 2019-01-12 ENCOUNTER — Encounter: Payer: Self-pay | Admitting: Neurology

## 2019-01-12 VITALS — Ht 61.5 in | Wt 204.0 lb

## 2019-01-12 DIAGNOSIS — G609 Hereditary and idiopathic neuropathy, unspecified: Secondary | ICD-10-CM | POA: Diagnosis not present

## 2019-01-12 MED ORDER — PREGABALIN 225 MG PO CAPS: 225.0000 mg | ORAL_CAPSULE | Freq: Two times a day (BID) | ORAL | 3 refills | Status: DC

## 2019-01-12 NOTE — Progress Notes (Signed)
Virtual Visit via Video Note The purpose of this virtual visit is to provide medical care while limiting exposure to the novel coronavirus.    Consent was obtained for video visit:  Yes.   Answered questions that patient had about telehealth interaction:  Yes.   I discussed the limitations, risks, security and privacy concerns of performing an evaluation and management service by telemedicine. I also discussed with the patient that there may be a patient responsible charge related to this service. The patient expressed understanding and agreed to proceed.  Pt location: Home Physician Location: office Name of referring provider:  Billie Ruddy, MD I connected with [name] at patients initiation/request on 01/12/2019 at  3:30 PM EDT by video enabled telemedicine application and verified that I am speaking with the correct person using two identifiers. Pt MRN:  [id number] Pt DOB:  [birth year] Video Participants:  [name]   History of Present Illness:  The patient was seen as a virtual video visit on 01/12/2019. She was last seen a year ago for peripheral polyneuropathy. Since her last visit, she reports symptoms are about the same, she has numbness, tingling, burning, but also has gout and venous insufficiency with a deep ache and skin color changes in her legs. She has been wearing compression stockings which helps. She still has balance issues, no weakness, no falls. She is taking Lyrica 225mg  BID without side effects. She continues to report right hip pain, worse low back pain. Pelvic xray did not show any hip arthropathy, degenerative changes seen in the lower lumbar spine. She was referred to PT and would still like to proceed once her gout is better addressed. She denies any headaches, dizziness, vision changes.   History on Initial Assessment 09/19/2016: This is a very pleasant 63 year old right-handed woman with a history of hypothyroidism, neurogenic bladder,  depression, anxiety, presenting to establish care for peripheral polyneuropathy. She recently moved to [place] from [place] with her son. She had been seeing neurologist Dr. [name] for the neuropathy. 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 Symptoms worsened over time with significant pain in both legs described as pain inside her legs like she wanted to scratch them all the time. She described pins and needles sensation, sometimes affecting her arms as well. She was initially started on gabapentin but it stopped working, and has been taking Lyrica for the past 10 years. She is taking Lyrica 225mg  BID without side effects. She reports symptom control is much better but not perfect, there are days the neuropathy bothers her, more when she is inactive. She has found that moving around helps relieve the symptoms. She fell last year and broke her left foot, and since then has tended to veer when she gets up. She denies any back pain, but reports pain in the right hip region radiating down the back of her right leg. She tells me her neurologist told her she has unusually brisk reflexes for the neuropathy, per records she has had an MRI brain, cervical, thoracic spine that were unremarkable. NCS reported as normal. Her B12 level was low and she has been on monthly B12 injections. She has a history of vertigo that significantly improved with vestibular therapy, she was told she has gaze instability, and has intermittent vertigo with positional changes. She denies any headaches, tinnitus, diplopia, dysarthria/dysphagia, neck pain, bowel/bladder dysfunction. She has a history of  neurogenic bladder on Detrol, low potassium on daily potassium supplements, and depression/anxiety on Zoloft. She reports a family history of peripheral polyneuropathy in her father and paternal grandfather.     Current Outpatient Medications on File Prior to Visit   Medication Sig Dispense Refill  . busPIRone (BUSPAR) 15 MG tablet TAKE 1 TABLET BY MOUTH TWICE A DAY 180 tablet 0  . cyanocobalamin (,VITAMIN B-12,) 1000 MCG/ML injection INJECT 1 ML IM ONCE MONTHLY 10 mL 2  . febuxostat (ULORIC) 40 MG tablet Take 40 mg by mouth daily.    . fexofenadine (ALLEGRA) 180 MG tablet Take 1 tablet (180 mg total) by mouth daily. 90 tablet 1  . levothyroxine (SYNTHROID, LEVOTHROID) 88 MCG tablet TAKE 1 TABLET BY MOUTH ONCE DAILY 90 tablet 1  . omeprazole (PRILOSEC) 20 MG capsule TAKE 1 CAPSULE BY MOUTH 2 TIMES DAILY BEFORE A MEAL 180 capsule 0  . sertraline (ZOLOFT) 100 MG tablet Take 1.5 tablets (150 mg total) by mouth daily. 135 tablet 2  . tolterodine (DETROL) 2 MG tablet Take 1 tablet (2 mg total) by mouth 2 (two) times daily. 180 tablet 2   No current facility-administered medications on file prior to visit.      Observations/Objective:   Vitals:   01/12/19 1525  Weight: 204 lb (92.5 kg)  Height: 5' 1.5" (1.562 m)   GEN:  The patient appears stated age and is in NAD.  Neurological examination: Patient is awake, alert, oriented x 3. No aphasia or dysarthria. Intact fluency and comprehension. Remote and recent memory intact. Able to name and repeat. Cranial nerves: Extraocular movements intact with no nystagmus. No facial asymmetry. Motor: moves all extremities symmetrically, at least anti-gravity x 4. No incoordination on finger to nose testing. Gait: narrow-based and steady, no ataxia. Negative Romberg test.   Assessment and Plan:   This is a very pleasant 63 yo RH woman with a history of hypothyroidism, neurogenic bladder, depression, anxiety, with peripheral polyneuropathy. Neuropathy stable, she would like to stay on the same dose of Lyrica 225mg  BID. Proceed with PT for back pain once able. Continue follow-up with PCP for gout management. She will follow-up in 1 year and knows to call for any changes.    Follow Up Instructions:   -I discussed the  assessment and treatment plan with the patient. The patient was provided an opportunity to ask questions and all were answered. The patient agreed with the plan and demonstrated an understanding of the instructions.   The patient was advised to call back or seek an in-person evaluation if the symptoms worsen or if the condition fails to improve as anticipated.    Cameron Sprang, MD

## 2019-01-31 NOTE — Progress Notes (Signed)
Virtual Visit via Video Note  I connected with Samantha Burch  on 02/01/19 at 11:00 AM EDT by a video enabled telemedicine application and verified that I am speaking with the correct person using two identifiers.  Location patient: home Location provider:work or home office Persons participating in the virtual visit: patient, provider  I discussed the limitations of evaluation and management by telemedicine and the availability of in person appointments. The patient expressed understanding and agreed to proceed.   Samantha Burch DOB: 1955/10/08 Encounter date: 02/01/2019  This is a 63 y.o. female who presents with Chief Complaint  Patient presents with  . Follow-up    History of present illness: How she is feeling varies day by day.   Has been following with podiatry and has a lot more confidence in what is going on. Went for venous testing and now is wearing support hose, which has really helped. She also saw [facility] rheumatology and was told it was gout causing problems. Seeing Leafy [facility]. Taking generic for uloric. Allopurinol affected liver enzymes so they switched to uloric. Think that maybe she has fatty liver; they will get [place] if still elevated. They feel that her gout will be cured, but she will have to stick with treatment.   She has a lot of factors that she thinks might be playing role in her not feeling well. Has not done well with uncertainty of situation, so it has been difficult for her with COVID. Feels she is doing the best she can. Feels like she would be ok if she could just maintain with anxiety right now. Worries because she was long time smoker. Doesn't feel like she is in good health. Taking a lot of medications. Overweight. Wanted to talk today about flu shot.   In past few weeks has occasional bout of vertigo. Is planning on doing some more exercises at home/ working on PT for this. Dr. Delice Lesch has ordered PT for her.   Allergies  Allergen  Reactions  . Sulfur    Current Meds  Medication Sig  . busPIRone (BUSPAR) 15 MG tablet Take 1 tablet (15 mg total) by mouth 2 (two) times daily.  . cyanocobalamin (,VITAMIN B-12,) 1000 MCG/ML injection INJECT 1 ML IM ONCE MONTHLY  . febuxostat (ULORIC) 40 MG tablet Take 40 mg by mouth daily.  . fexofenadine (ALLEGRA) 180 MG tablet Take 1 tablet (180 mg total) by mouth daily.  Marland Kitchen levothyroxine (SYNTHROID) 88 MCG tablet Take 1 tablet (88 mcg total) by mouth daily.  Marland Kitchen omeprazole (PRILOSEC) 20 MG capsule TAKE 1 CAPSULE BY MOUTH 2 TIMES DAILY BEFORE A MEAL  . pregabalin (LYRICA) 225 MG capsule Take 1 capsule (225 mg total) by mouth 2 (two) times daily.  . sertraline (ZOLOFT) 100 MG tablet Take 1.5 tablets (150 mg total) by mouth daily.  Marland Kitchen tolterodine (DETROL) 2 MG tablet Take 1 tablet (2 mg total) by mouth 2 (two) times daily.  . [DISCONTINUED] busPIRone (BUSPAR) 15 MG tablet TAKE 1 TABLET BY MOUTH TWICE A DAY  . [DISCONTINUED] cyanocobalamin (,VITAMIN B-12,) 1000 MCG/ML injection INJECT 1 ML IM ONCE MONTHLY  . [DISCONTINUED] levothyroxine (SYNTHROID, LEVOTHROID) 88 MCG tablet TAKE 1 TABLET BY MOUTH ONCE DAILY  . [DISCONTINUED] sertraline (ZOLOFT) 100 MG tablet Take 1.5 tablets (150 mg total) by mouth daily.  . [DISCONTINUED] sertraline (ZOLOFT) 100 MG tablet Take 1.5 tablets (150 mg total) by mouth daily.  . [DISCONTINUED] tolterodine (DETROL) 2 MG tablet Take 1 tablet (2 mg total) by  mouth 2 (two) times daily.    Review of Systems  Constitutional: Negative for chills, fatigue and fever.  Respiratory: Negative for cough, chest tightness, shortness of breath and wheezing.   Cardiovascular: Negative for chest pain, palpitations and leg swelling.    Objective:  LMP 05/06/2006 (Approximate)       BP Readings from Last 3 Encounters:  04/07/18 130/69  03/27/18 140/68  03/11/18 (!) 142/80   Wt Readings from Last 3 Encounters:  01/12/19 204 lb (92.5 kg)  03/27/18 205 lb (93 kg)  03/11/18  206 lb 6.4 oz (93.6 kg)    EXAM:  GENERAL: alert, oriented, appears well and in no acute distress  HEENT: atraumatic, conjunctiva clear, no obvious abnormalities on inspection of external nose and ears  NECK: normal movements of the head and neck  LUNGS: on inspection no signs of respiratory distress, breathing rate appears normal, no obvious gross SOB, gasping or wheezing  CV: no obvious cyanosis  MS: moves all visible extremities without noticeable abnormality  PSYCH/NEURO: pleasant and cooperative.  Although we discussed a lot of triggers for her anxiety, overall, she seems to be managing anxiety fairly well.   Assessment/Plan  1. Anxiety Anxiety has been stable.  She does not wish to make any medication changes.  She feels that control is near baseline. - busPIRone (BUSPAR) 15 MG tablet; Take 1 tablet (15 mg total) by mouth 2 (two) times daily.  Dispense: 180 tablet; Refill: 1 - sertraline (ZOLOFT) 100 MG tablet; Take 1.5 tablets (150 mg total) by mouth daily.  Dispense: 135 tablet; Refill: 1   2. Depression, unspecified depression type Mood has been stable, in spite of additional stressors.  She has some peace of mind having better idea and plan for circulation issues in her legs and feet.  She is doing very well with compression stockings and follow-up with podiatry, which is helped her mood.  3. Chronic gout without tophus, unspecified cause, unspecified site See above.  She is following with rheumatology. - Uric acid; Future  4. Acquired hypothyroidism Thyroid has been stable.  I have ordered blood work today and we will see if rheumatology is able to complete these labs at their office.  If not, she can return to our office for blood work at her convenience. - levothyroxine (SYNTHROID) 88 MCG tablet; Take 1 tablet (88 mcg total) by mouth daily.  Dispense: 90 tablet; Refill: 1 - TSH; Future  5. Neurogenic bladder - tolterodine (DETROL) 2 MG tablet; Take 1 tablet (2 mg  total) by mouth 2 (two) times daily.  Dispense: 180 tablet; Refill: 1  8. Idiopathic peripheral neuropathy - CBC with Differential/Platelet; Future - Comprehensive metabolic panel; Future  9. Hyperlipidemia, unspecified hyperlipidemia type  - Lipid panel; Future  10. Need for shingles vaccine  - Zoster Vaccine Adjuvanted [facility]) injection; Inject 0.5 mLs into the muscle once for 1 dose. Repeat in 2-6 months  Dispense: 0.5 mL; Refill: 0  11. B12 deficiency Taking B12 injections chronically - Vitamin B12; Future  12. Vitamin D deficiency - VITAMIN D 25 Hydroxy (Vit-D Deficiency, Fractures); Future  Return in about 3 months (around 05/03/2019) for Chronic condition visit. We will see if she can complete blood work at [facility] rheumatology, but if not she will set up lab visit to complete blood work for [place] at her convenience.   I discussed the assessment and treatment plan with the patient. The patient was provided an opportunity to ask questions and all were answered. The patient  agreed with the plan and demonstrated an understanding of the instructions.   The patient was advised to call back or seek an in-person evaluation if the symptoms worsen or if the condition fails to improve as anticipated.  I provided 38 minutes of non-face-to-face time during this encounter.   Micheline Rough, MD

## 2019-02-01 ENCOUNTER — Other Ambulatory Visit: Payer: Self-pay

## 2019-02-01 ENCOUNTER — Encounter: Payer: Self-pay | Admitting: Family Medicine

## 2019-02-01 ENCOUNTER — Ambulatory Visit (INDEPENDENT_AMBULATORY_CARE_PROVIDER_SITE_OTHER): Payer: Medicare Other | Admitting: Family Medicine

## 2019-02-01 DIAGNOSIS — E538 Deficiency of other specified B group vitamins: Secondary | ICD-10-CM

## 2019-02-01 DIAGNOSIS — G609 Hereditary and idiopathic neuropathy, unspecified: Secondary | ICD-10-CM | POA: Diagnosis not present

## 2019-02-01 DIAGNOSIS — E785 Hyperlipidemia, unspecified: Secondary | ICD-10-CM

## 2019-02-01 DIAGNOSIS — M1A9XX0 Chronic gout, unspecified, without tophus (tophi): Secondary | ICD-10-CM | POA: Diagnosis not present

## 2019-02-01 DIAGNOSIS — E559 Vitamin D deficiency, unspecified: Secondary | ICD-10-CM

## 2019-02-01 DIAGNOSIS — F329 Major depressive disorder, single episode, unspecified: Secondary | ICD-10-CM

## 2019-02-01 DIAGNOSIS — F32A Depression, unspecified: Secondary | ICD-10-CM

## 2019-02-01 DIAGNOSIS — N319 Neuromuscular dysfunction of bladder, unspecified: Secondary | ICD-10-CM

## 2019-02-01 DIAGNOSIS — E039 Hypothyroidism, unspecified: Secondary | ICD-10-CM

## 2019-02-01 DIAGNOSIS — Z23 Encounter for immunization: Secondary | ICD-10-CM

## 2019-02-01 DIAGNOSIS — F419 Anxiety disorder, unspecified: Secondary | ICD-10-CM

## 2019-02-01 MED ORDER — SERTRALINE HCL 100 MG PO TABS: 150.0000 mg | ORAL_TABLET | Freq: Every day | ORAL | 1 refills | Status: DC

## 2019-02-01 MED ORDER — CYANOCOBALAMIN 1000 MCG/ML IJ SOLN: INTRAMUSCULAR | 2 refills | Status: DC

## 2019-02-01 MED ORDER — SHINGRIX 50 MCG/0.5ML IM SUSR: 0.5000 mL | Freq: Once | INTRAMUSCULAR | 0 refills | Status: AC

## 2019-02-01 MED ORDER — BUSPIRONE HCL 15 MG PO TABS: 15.0000 mg | ORAL_TABLET | Freq: Two times a day (BID) | ORAL | 1 refills | Status: DC

## 2019-02-01 MED ORDER — LEVOTHYROXINE SODIUM 88 MCG PO TABS: 88.0000 ug | ORAL_TABLET | Freq: Every day | ORAL | 1 refills | Status: DC

## 2019-02-01 MED ORDER — TOLTERODINE TARTRATE 2 MG PO TABS: 2.0000 mg | ORAL_TABLET | Freq: Two times a day (BID) | ORAL | 1 refills | Status: DC

## 2019-02-02 ENCOUNTER — Telehealth: Payer: Self-pay | Admitting: *Deleted

## 2019-02-02 DIAGNOSIS — E538 Deficiency of other specified B group vitamins: Secondary | ICD-10-CM | POA: Diagnosis not present

## 2019-02-02 DIAGNOSIS — M1A09X0 Idiopathic chronic gout, multiple sites, without tophus (tophi): Secondary | ICD-10-CM | POA: Diagnosis not present

## 2019-02-02 DIAGNOSIS — E559 Vitamin D deficiency, unspecified: Secondary | ICD-10-CM | POA: Diagnosis not present

## 2019-02-02 DIAGNOSIS — E039 Hypothyroidism, unspecified: Secondary | ICD-10-CM | POA: Diagnosis not present

## 2019-02-02 DIAGNOSIS — E785 Hyperlipidemia, unspecified: Secondary | ICD-10-CM | POA: Diagnosis not present

## 2019-02-02 DIAGNOSIS — M2550 Pain in unspecified joint: Secondary | ICD-10-CM | POA: Diagnosis not present

## 2019-02-02 NOTE — Telephone Encounter (Signed)
Left a detailed message at the pts cell number for the pt to call the office to schedule an appt for a flu shot.  I called [facility] at [phone], spoke Otila Kluver and informed her of the labs that was ordered yesterday.  She stated the pt has an appt this afternoon and she will forward this message to the provider.

## 2019-02-02 NOTE — Telephone Encounter (Signed)
I called Brandy and informed her of the diagnoses for each test.

## 2019-02-02 NOTE — Telephone Encounter (Signed)
Samantha Burch, with [facility] rheumatology, requesting call back with diagnosis codes for labs.

## 2019-03-09 DIAGNOSIS — M1A09X0 Idiopathic chronic gout, multiple sites, without tophus (tophi): Secondary | ICD-10-CM | POA: Diagnosis not present

## 2019-03-11 ENCOUNTER — Other Ambulatory Visit: Payer: Self-pay

## 2019-03-11 ENCOUNTER — Ambulatory Visit (INDEPENDENT_AMBULATORY_CARE_PROVIDER_SITE_OTHER): Payer: Medicare Other

## 2019-03-11 DIAGNOSIS — Z23 Encounter for immunization: Secondary | ICD-10-CM | POA: Diagnosis not present

## 2019-04-06 DIAGNOSIS — M2550 Pain in unspecified joint: Secondary | ICD-10-CM | POA: Diagnosis not present

## 2019-04-06 DIAGNOSIS — E669 Obesity, unspecified: Secondary | ICD-10-CM | POA: Diagnosis not present

## 2019-04-06 DIAGNOSIS — R7989 Other specified abnormal findings of blood chemistry: Secondary | ICD-10-CM | POA: Diagnosis not present

## 2019-04-06 DIAGNOSIS — M1A09X0 Idiopathic chronic gout, multiple sites, without tophus (tophi): Secondary | ICD-10-CM | POA: Diagnosis not present

## 2019-04-06 DIAGNOSIS — Z6838 Body mass index (BMI) 38.0-38.9, adult: Secondary | ICD-10-CM | POA: Diagnosis not present

## 2019-07-14 ENCOUNTER — Other Ambulatory Visit: Payer: Self-pay | Admitting: Neurology

## 2019-07-14 DIAGNOSIS — G609 Hereditary and idiopathic neuropathy, unspecified: Secondary | ICD-10-CM

## 2019-07-16 ENCOUNTER — Encounter: Payer: Self-pay | Admitting: Neurology

## 2019-07-16 NOTE — Progress Notes (Addendum)
[name] KeyIrwin Brakeman - PA Case ID: [id number] - Rx #[id number] Need help? Call us at [phone] Outcome Approvedtoday Your request has been approved Drug Pregabalin 225MG  capsules Form [employer] Electronic PA Form Original Claim Info [id number] [place] REQUIRED-MD CONTACT [facility] PRIOR AUTHORIZATION(PHARMACY HELP DESK [phone])

## 2019-07-23 ENCOUNTER — Ambulatory Visit: Payer: Medicare Other | Attending: Internal Medicine

## 2019-07-23 DIAGNOSIS — Z23 Encounter for immunization: Secondary | ICD-10-CM

## 2019-07-23 NOTE — Progress Notes (Signed)
   Covid-19 Vaccination Clinic  Name:  Samantha Burch    MRN: [id number] DOB: 07-05-1955  07/23/2019  Samantha Burch was observed post Covid-19 immunization for 15 minutes without incident. She was provided with Vaccine Information Sheet and instruction to access the V-Safe system.   Samantha Burch was instructed to call 911 with any severe reactions post vaccine: Marland Kitchen Difficulty breathing  . Swelling of face and throat  . A fast heartbeat  . A bad rash all over body  . Dizziness and weakness   Immunizations Administered    Name Date Dose VIS Date Route   Pfizer COVID-19 Vaccine 07/23/2019  3:57 PM 0.3 mL 04/16/2019 Intramuscular   Manufacturer: [place]   Lot: [id number]   [place]: [id number]

## 2019-07-30 ENCOUNTER — Ambulatory Visit (HOSPITAL_COMMUNITY)
Admission: EM | Admit: 2019-07-30 | Discharge: 2019-07-30 | Disposition: A | Discharge status: Home / Self Care | Payer: Medicare Other | Attending: Urgent Care | Admitting: Urgent Care

## 2019-07-30 ENCOUNTER — Other Ambulatory Visit: Payer: Self-pay

## 2019-07-30 ENCOUNTER — Encounter (HOSPITAL_COMMUNITY): Payer: Self-pay

## 2019-07-30 ENCOUNTER — Ambulatory Visit: Payer: Medicare Other | Admitting: Family Medicine

## 2019-07-30 DIAGNOSIS — S50319A Abrasion of unspecified elbow, initial encounter: Secondary | ICD-10-CM | POA: Diagnosis not present

## 2019-07-30 DIAGNOSIS — S5001XA Contusion of right elbow, initial encounter: Secondary | ICD-10-CM

## 2019-07-30 DIAGNOSIS — S5002XA Contusion of left elbow, initial encounter: Secondary | ICD-10-CM

## 2019-07-30 DIAGNOSIS — M542 Cervicalgia: Secondary | ICD-10-CM

## 2019-07-30 DIAGNOSIS — B360 Pityriasis versicolor: Secondary | ICD-10-CM

## 2019-07-30 DIAGNOSIS — W19XXXA Unspecified fall, initial encounter: Secondary | ICD-10-CM

## 2019-07-30 DIAGNOSIS — M25551 Pain in right hip: Secondary | ICD-10-CM

## 2019-07-30 MED ORDER — ACETAMINOPHEN 325 MG PO TABS
ORAL_TABLET | ORAL | Status: AC
  Filled 2019-07-30: qty 2

## 2019-07-30 MED ORDER — ACETAMINOPHEN 325 MG PO TABS
650.0000 mg | ORAL_TABLET | Freq: Once | ORAL | Status: AC
  Administered 2019-07-30: 11:00:00 650 mg via ORAL

## 2019-07-30 MED ORDER — METHOCARBAMOL 500 MG PO TABS: 500.0000 mg | ORAL_TABLET | Freq: Three times a day (TID) | ORAL | 0 refills | Status: DC | PRN

## 2019-07-30 MED ORDER — ITRACONAZOLE 200 MG PO TABS: 200.0000 mg | ORAL_TABLET | Freq: Every day | ORAL | 0 refills | Status: DC

## 2019-07-30 NOTE — ED Triage Notes (Signed)
Pt states she fell while sidewalk and a slippery slope of mud and rolled onto ground several times. Pt c/o right sided pain to neck, arm, hip. Small abrasion to right elbow noted; bleeding controlled. Denies LOC, head pain, or change in vision. Is able to ambulate and move all extremities.

## 2019-07-30 NOTE — ED Provider Notes (Signed)
[place]   MRN: [id number] DOB: 12/23/1955  Subjective:   Samantha Burch is a 64 y.o. female presenting for suffering an accidental fall this morning.  Patient was walking down the sidewalk that was very muddy and with.  She states that she slipped and tried to catch herself on the way down by holding rail.  She ultimately ended up falling and rolling a few times down the sidewalk.  Denies head injury, loss consciousness, dizziness, confusion, headache.  She suffered scrapes to her elbows and has had right-sided pain, neck pain, mild hip pain.  Has not had difficulty using her limbs apart from feeling pain when she flexes at elbows.  Her primary concern is that she may have a fracture.  Has not taken anything for pain.  Regarding her blood pressure, states that she normally has really good blood pressure but is very upset about the fall.  States that she regularly checks her blood pressure and is usually in the [id number] or [id number] systolic.  She does have a PCP.  No current facility-administered medications for this encounter.  Current Outpatient Medications:  .  busPIRone (BUSPAR) 15 MG tablet, Take 1 tablet (15 mg total) by mouth 2 (two) times daily., Disp: 180 tablet, Rfl: 1 .  cyanocobalamin (,VITAMIN B-12,) 1000 MCG/ML injection, INJECT 1 ML IM ONCE MONTHLY, Disp: 10 mL, Rfl: 2 .  febuxostat (ULORIC) 40 MG tablet, Take 40 mg by mouth daily., Disp: , Rfl:  .  fexofenadine (ALLEGRA) 180 MG tablet, Take 1 tablet (180 mg total) by mouth daily., Disp: 90 tablet, Rfl: 1 .  levothyroxine (SYNTHROID) 88 MCG tablet, Take 1 tablet (88 mcg total) by mouth daily., Disp: 90 tablet, Rfl: 1 .  omeprazole (PRILOSEC) 20 MG capsule, TAKE 1 CAPSULE BY MOUTH 2 TIMES DAILY BEFORE A MEAL, Disp: 180 capsule, Rfl: 0 .  pregabalin (LYRICA) 225 MG capsule, TAKE 1 CAPSULE (225 MG TOTAL) BY MOUTH 2 (TWO) TIMES DAILY., Disp: 180 capsule, Rfl: 3 .  sertraline (ZOLOFT) 100 MG tablet, Take 1.5 tablets (150 mg  total) by mouth daily., Disp: 135 tablet, Rfl: 1 .  tolterodine (DETROL) 2 MG tablet, Take 1 tablet (2 mg total) by mouth 2 (two) times daily., Disp: 180 tablet, Rfl: 1   Allergies  Allergen Reactions  . Sulfur     Past Medical History:  Diagnosis Date  . Anxiety   . Depression   . GERD (gastroesophageal reflux disease)   . Gout   . Hyperlipidemia   . Hypoglycemia   . Hypothyroidism   . Neurogenic bladder   . Neuropathy   . Urinary incontinence      Past Surgical History:  Procedure Laterality Date  . MULTIPLE TOOTH EXTRACTIONS  2003-2004   for dentures    Family History  Problem Relation Age of Onset  . Ovarian cancer Mother   . Alcohol abuse Father   . Hypertension Father   . Diabetes type II Father   . Neuropathy Father   . Neuropathy Paternal Grandmother   . Colon cancer Maternal Uncle     Social History   Tobacco Use  . Smoking status: Former Smoker    Packs/day: 1.00    Years: 30.00    Pack years: 30.00    Types: Cigarettes    Quit date: 05/07/2011    Years since quitting: 8.2  . Smokeless tobacco: Never Used  Substance Use Topics  . Alcohol use: No  . Drug use: No    ROS  Objective:   Vitals: BP (!) 174/92 (BP Location: Left Arm)   Pulse 76   Temp 98 F (36.7 C) (Oral)   Resp 18   LMP 05/06/2006 (Approximate)   SpO2 97%   BP on recheck was 165/90.   BP Readings from Last 3 Encounters:  07/30/19 (!) 174/92  04/07/18 130/69  03/27/18 140/68   Physical Exam Constitutional:      General: She is not in acute distress.    Appearance: Normal appearance. She is well-developed. She is not ill-appearing, toxic-appearing or diaphoretic.  HENT:     Head: Normocephalic and atraumatic.     Right Ear: Tympanic membrane and ear canal normal. No drainage or tenderness. No middle ear effusion. Tympanic membrane is not erythematous.     Left Ear: Tympanic membrane and ear canal normal. No drainage or tenderness.  No middle ear effusion. Tympanic  membrane is not erythematous.     Nose: Nose normal. No congestion or rhinorrhea.     Mouth/Throat:     Mouth: Mucous membranes are moist. No oral lesions.     Pharynx: Oropharynx is clear. No pharyngeal swelling, oropharyngeal exudate, posterior oropharyngeal erythema or uvula swelling.     Tonsils: No tonsillar exudate or tonsillar abscesses.  Eyes:     General: No scleral icterus.       Right eye: No discharge.        Left eye: No discharge.     Extraocular Movements: Extraocular movements intact.     Right eye: Normal extraocular motion.     Left eye: Normal extraocular motion.     Conjunctiva/sclera: Conjunctivae normal.     Pupils: Pupils are equal, round, and reactive to light.  Cardiovascular:     Rate and Rhythm: Normal rate and regular rhythm.     Pulses: Normal pulses.     Heart sounds: Normal heart sounds. No murmur. No friction rub. No gallop.   Pulmonary:     Effort: Pulmonary effort is normal. No respiratory distress.     Breath sounds: Normal breath sounds. No stridor. No wheezing, rhonchi or rales.  Musculoskeletal:     Cervical back: Normal range of motion and neck supple.     Comments: 2 abrasions to extensor surface of elbow, bleeding is controlled and there is minimal tenderness.  Patient has full range of motion for upper and lower extremities.  There is no movement pain.  Patient has mild tenderness over paraspinal muscles of her neck but has excellent range of motion.  No obvious scalp contusion or laceration.  Patient is able to ambulate without assistance.  Lymphadenopathy:     Cervical: No cervical adenopathy.  Skin:    General: Skin is warm and dry.     Findings: No rash.       Neurological:     General: No focal deficit present.     Mental Status: She is alert and oriented to person, place, and time.     Cranial Nerves: No cranial nerve deficit.     Motor: No weakness.     Coordination: Coordination normal.     Gait: Gait normal.     Deep Tendon  Reflexes: Reflexes normal.  Psychiatric:        Mood and Affect: Mood normal.        Behavior: Behavior normal.        Thought Content: Thought content normal.        Judgment: Judgment normal.      Assessment and Plan :  1. Abrasion, elbow w/o infection   2. Fall, initial encounter   3. Contusion of left elbow, initial encounter   4. Contusion of right elbow, initial encounter   5. Right hip pain   6. Neck pain   7. Tinea versicolor     Physical exam findings very reassuring against fracture or head injury.  Patient given Tylenol in clinic.  Counseled on conservative management, rest, wound care reviewed.  Discussed signs of head injury although I have low suspicion for this given the nature of her fall and physical exam findings.  Deferred x-rays, patient is in agreement.  Patient is to start itraconazole to address tinea versicolor. Counseled patient on potential for adverse effects with medications prescribed/recommended today, ER and return-to-clinic precautions discussed, patient verbalized understanding.    Jaynee Eagles, [place] 07/31/19 [phone]

## 2019-07-30 NOTE — Discharge Instructions (Addendum)
You may take 500mg -650mg  Tylenol with every 6 hours for pain, aches.  You can take this together with Robaxin, a muscle relaxant.  If it makes you very sleepy than take it at bedtime only.  Otherwise if muscle relaxant does not make you sleepy than you are welcome to take it 2-3 times a day as needed.  For the rash on your back we will use itraconazole.  This is only to be taken once daily for a week.  If you develop confusion, dizziness, severe headache, weakness of your limbs then this could be a sign that your fall was worse than we are seeing today.  Come back for recheck if this is the case.

## 2019-08-18 ENCOUNTER — Ambulatory Visit: Payer: Medicare Other | Attending: Internal Medicine

## 2019-08-18 DIAGNOSIS — Z23 Encounter for immunization: Secondary | ICD-10-CM

## 2019-08-18 NOTE — Progress Notes (Signed)
   Covid-19 Vaccination Clinic  Name:  Samantha Burch    MRN: [id number] DOB: November 03, 1955  08/18/2019  Ms. Samantha Burch was observed post Covid-19 immunization for 15 minutes without incident. She was provided with Vaccine Information Sheet and instruction to access the V-Safe system.   Ms. Samantha Burch was instructed to call 911 with any severe reactions post vaccine: Marland Kitchen Difficulty breathing  . Swelling of face and throat  . A fast heartbeat  . A bad rash all over body  . Dizziness and weakness   Immunizations Administered    Name Date Dose VIS Date Route   Pfizer COVID-19 Vaccine 08/18/2019  1:51 PM 0.3 mL 04/16/2019 Intramuscular   Manufacturer: [place]   Lot: [id number]   [place]: [id number]

## 2019-08-28 ENCOUNTER — Other Ambulatory Visit: Payer: Self-pay | Admitting: Family Medicine

## 2019-08-28 DIAGNOSIS — E039 Hypothyroidism, unspecified: Secondary | ICD-10-CM

## 2019-09-16 DIAGNOSIS — M1A09X0 Idiopathic chronic gout, multiple sites, without tophus (tophi): Secondary | ICD-10-CM | POA: Diagnosis not present

## 2019-09-16 DIAGNOSIS — M2550 Pain in unspecified joint: Secondary | ICD-10-CM | POA: Diagnosis not present

## 2019-09-22 ENCOUNTER — Other Ambulatory Visit: Payer: Self-pay

## 2019-09-22 ENCOUNTER — Ambulatory Visit (HOSPITAL_COMMUNITY)
Admission: EM | Admit: 2019-09-22 | Discharge: 2019-09-22 | Disposition: A | Discharge status: Home / Self Care | Payer: Medicare Other | Attending: Family Medicine | Admitting: Family Medicine

## 2019-09-22 ENCOUNTER — Encounter (HOSPITAL_COMMUNITY): Payer: Self-pay

## 2019-09-22 DIAGNOSIS — D225 Melanocytic nevi of trunk: Secondary | ICD-10-CM | POA: Diagnosis not present

## 2019-09-22 MED ORDER — TRIAMCINOLONE ACETONIDE 0.1 % EX CREA: 1.0000 "application " | TOPICAL_CREAM | Freq: Two times a day (BID) | CUTANEOUS | 0 refills | Status: DC

## 2019-09-22 NOTE — ED Triage Notes (Signed)
Patient reports she has a mole on her back that started bleeding yesterday. No bleeding today, but itching today.

## 2019-09-22 NOTE — ED Provider Notes (Signed)
[facility]   [id number] 09/22/19 Arrival Time: [id number]  ASSESSMENT & PLAN:  1. Irritated nevus of back     Meds ordered this encounter  Medications  . triamcinolone cream (KENALOG) 0.1 %    Sig: Apply 1 application topically 2 (two) times daily.    Dispense:  15 g    Refill:  0   Keep covered to reduce friction. May benefit from removal. Plans f/u with PCP. No signs of infection.  Reviewed expectations re: course of current medical issues. Questions answered. Outlined signs and symptoms indicating need for more acute intervention. Patient verbalized understanding. After Visit Summary given.   SUBJECTIVE:  Samantha Burch is a 64 y.o. female who presents with a skin complaint. Reports an irritated mole on her back at bra line. Present since childhood. Irritation over the past several days. Bled yesterday; none today. Afebrile. No drainage from area.   OBJECTIVE: Vitals:   09/22/19 1542  BP: 132/69  Pulse: 63  Resp: 14  Temp: 98.6 F (37 C)  TempSrc: Oral  SpO2: 100%    General appearance: alert; no distress HEENT: [place]; AT Neck: supple with FROM Lungs: clear to auscultation bilaterally Heart: regular rate and rhythm Extremities: no edema; moves all extremities normally Skin: at bra line on back there is an inflamed/irritated mole; no bleeding Psychological: alert and cooperative; normal mood and affect  Allergies  Allergen Reactions  . Sulfa Antibiotics   . Sulfur     Past Medical History:  Diagnosis Date  . Anxiety   . Depression   . GERD (gastroesophageal reflux disease)   . Gout   . Hyperlipidemia   . Hypoglycemia   . Hypothyroidism   . Neurogenic bladder   . Neuropathy   . Urinary incontinence    Social History   Socioeconomic History  . Marital status: Divorced    Spouse name: Not on file  . Number of children: Not on file  . Years of education: Not on file  . Highest education level: Not on file  Occupational History  .  Occupation: Disabled  Tobacco Use  . Smoking status: Former Smoker    Packs/day: 1.00    Years: 30.00    Pack years: 30.00    Types: Cigarettes    Quit date: 05/07/2011    Years since quitting: 8.3  . Smokeless tobacco: Never Used  Substance and Sexual Activity  . Alcohol use: No  . Drug use: No  . Sexual activity: Not on file  Other Topics Concern  . Not on file  Social History Narrative   Right handed      Highest level of edu- 12th grade      Son lives with pt ( age 69)      Social Determinants of Health   Financial Resource Strain:   . Difficulty of Paying Living Expenses:   Food Insecurity:   . Worried About Charity fundraiser in the Last Year:   . Arboriculturist in the Last Year:   Transportation Needs:   . Film/video editor (Medical):   Marland Kitchen Lack of Transportation (Non-Medical):   Physical Activity:   . Days of Exercise per Week:   . Minutes of Exercise per Session:   Stress:   . Feeling of Stress :   Social Connections:   . Frequency of Communication with Friends and Family:   . Frequency of Social Gatherings with Friends and Family:   . Attends Religious Services:   .  Active Member of Clubs or Organizations:   . Attends Archivist Meetings:   Marland Kitchen Marital Status:   Intimate Partner Violence:   . Fear of Current or Ex-Partner:   . Emotionally Abused:   Marland Kitchen Physically Abused:   . Sexually Abused:    Family History  Problem Relation Age of Onset  . Ovarian cancer Mother   . Alcohol abuse Father   . Hypertension Father   . Diabetes type II Father   . Neuropathy Father   . Neuropathy Paternal Grandmother   . Colon cancer Maternal Uncle    Past Surgical History:  Procedure Laterality Date  . MULTIPLE TOOTH EXTRACTIONS  2003-2004   for dentures     Vanessa Kick, MD 09/22/19 [phone]

## 2019-09-22 NOTE — ED Triage Notes (Signed)
Pt reports mole on back that is "extremely irritated".  Itching in the area and started bleeding (possibly from scratching) x 2 days ago.  Pt feels there is something "really wrong" with it.  Is located just under clasps of bra and pt feels the new, tighter bra she has been wearing may have caused the irritation.

## 2019-10-05 DIAGNOSIS — M1A09X0 Idiopathic chronic gout, multiple sites, without tophus (tophi): Secondary | ICD-10-CM | POA: Diagnosis not present

## 2019-10-05 DIAGNOSIS — Z6838 Body mass index (BMI) 38.0-38.9, adult: Secondary | ICD-10-CM | POA: Diagnosis not present

## 2019-10-05 DIAGNOSIS — E669 Obesity, unspecified: Secondary | ICD-10-CM | POA: Diagnosis not present

## 2019-10-05 DIAGNOSIS — R7989 Other specified abnormal findings of blood chemistry: Secondary | ICD-10-CM | POA: Diagnosis not present

## 2019-10-05 DIAGNOSIS — M2550 Pain in unspecified joint: Secondary | ICD-10-CM | POA: Diagnosis not present

## 2019-12-01 ENCOUNTER — Other Ambulatory Visit: Payer: Self-pay | Admitting: Family Medicine

## 2019-12-01 DIAGNOSIS — E039 Hypothyroidism, unspecified: Secondary | ICD-10-CM

## 2020-01-05 ENCOUNTER — Encounter: Payer: Self-pay | Admitting: Family Medicine

## 2020-01-05 ENCOUNTER — Telehealth (INDEPENDENT_AMBULATORY_CARE_PROVIDER_SITE_OTHER): Payer: Medicare Other | Admitting: Family Medicine

## 2020-01-05 DIAGNOSIS — K219 Gastro-esophageal reflux disease without esophagitis: Secondary | ICD-10-CM | POA: Diagnosis not present

## 2020-01-05 DIAGNOSIS — E538 Deficiency of other specified B group vitamins: Secondary | ICD-10-CM

## 2020-01-05 DIAGNOSIS — Z13220 Encounter for screening for lipoid disorders: Secondary | ICD-10-CM

## 2020-01-05 DIAGNOSIS — F419 Anxiety disorder, unspecified: Secondary | ICD-10-CM

## 2020-01-05 DIAGNOSIS — K760 Fatty (change of) liver, not elsewhere classified: Secondary | ICD-10-CM

## 2020-01-05 DIAGNOSIS — F339 Major depressive disorder, recurrent, unspecified: Secondary | ICD-10-CM | POA: Diagnosis not present

## 2020-01-05 DIAGNOSIS — N3281 Overactive bladder: Secondary | ICD-10-CM | POA: Diagnosis not present

## 2020-01-05 DIAGNOSIS — E559 Vitamin D deficiency, unspecified: Secondary | ICD-10-CM

## 2020-01-05 DIAGNOSIS — M109 Gout, unspecified: Secondary | ICD-10-CM | POA: Diagnosis not present

## 2020-01-05 DIAGNOSIS — L219 Seborrheic dermatitis, unspecified: Secondary | ICD-10-CM

## 2020-01-05 DIAGNOSIS — E162 Hypoglycemia, unspecified: Secondary | ICD-10-CM

## 2020-01-05 DIAGNOSIS — E039 Hypothyroidism, unspecified: Secondary | ICD-10-CM | POA: Diagnosis not present

## 2020-01-05 DIAGNOSIS — G609 Hereditary and idiopathic neuropathy, unspecified: Secondary | ICD-10-CM | POA: Diagnosis not present
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
## 2020-01-06 ENCOUNTER — Telehealth: Payer: Self-pay | Admitting: *Deleted

## 2020-01-06 NOTE — Telephone Encounter (Signed)
Spoke with the pt and scheduled a lab appt for 9/13.  Patient stated she prefers to have a flu shot at a later date and will sign a release form when she comes in for the lab visit.

## 2020-01-06 NOTE — Telephone Encounter (Signed)
-----   Message from Caren Macadam, MD sent at 01/05/2020  2:14 PM EDT ----- Please call patient to set up lab visit (labs ordered); she would like flu shot at same time IF we have it? Sign release while here for Dr. Young/liver/bloodwork/imaging.

## 2020-01-12 ENCOUNTER — Other Ambulatory Visit: Payer: Self-pay | Admitting: Family Medicine

## 2020-01-12 DIAGNOSIS — F32A Depression, unspecified: Secondary | ICD-10-CM

## 2020-01-14 ENCOUNTER — Other Ambulatory Visit: Payer: Self-pay

## 2020-01-14 ENCOUNTER — Encounter: Payer: Self-pay | Admitting: Neurology

## 2020-01-14 ENCOUNTER — Ambulatory Visit (INDEPENDENT_AMBULATORY_CARE_PROVIDER_SITE_OTHER): Payer: Medicare Other | Admitting: Neurology

## 2020-01-14 VITALS — BP 157/72 | HR 73 | Ht 61.0 in | Wt 202.8 lb

## 2020-01-14 DIAGNOSIS — G609 Hereditary and idiopathic neuropathy, unspecified: Secondary | ICD-10-CM

## 2020-01-14 MED ORDER — PREGABALIN 225 MG PO CAPS: 225.0000 mg | ORAL_CAPSULE | Freq: Two times a day (BID) | ORAL | 3 refills | Status: DC

## 2020-01-14 NOTE — Patient Instructions (Signed)
Always good to see you. Refills have been sent for the Lyrica 225mg  twice a day. Call our office once ready to proceed with physical therapy for your back. Follow-up in 1 year, call for any changes.

## 2020-01-14 NOTE — Progress Notes (Signed)
NEUROLOGY FOLLOW UP OFFICE NOTE  Samantha Burch [id number] 03/29/1956  HISTORY OF PRESENT ILLNESS: I had the pleasure of seeing Samantha Burch in follow-up in the neurology clinic on 01/14/2020.  The patient was last seen a year ago for peripheral polyneuropathy. She is on Lyrica 225mg  BID without side effects. Since her last visit, she reports neuropathy has been stable. She has tingling, numbness, and burning, with good response to Lyrica. Her back continues to bother her. She does not sleep comfortably, tired upon awakening. She states she is so behind in every area of her life. She lives with her son. She denies any headaches, neck pain, bowel/bladder dysfunction.    History on Initial Assessment 09/19/2016: This is a very pleasant 64 year old right-handed woman with a history of hypothyroidism, neurogenic bladder, depression, anxiety, presenting to establish care for peripheral polyneuropathy. She recently moved to [place] from [place] with her son. She had been seeing neurologist Dr. [name] for the neuropathy. 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 Symptoms worsened over time with significant pain in both legs described as pain inside her legs like she wanted to scratch them all the time. She described pins and needles sensation, sometimes affecting her arms as well. She was initially started on gabapentin but it stopped working, and has been taking Lyrica for the past 10 years. She is taking Lyrica 225mg  BID without side effects. She reports symptom control is much better but not perfect, there are days the neuropathy bothers her, more when she is inactive. She has found that moving around helps relieve the symptoms. She fell last year and broke her left foot, and since then has tended to veer when she gets up. She denies any back pain, but reports pain in the right hip region radiating down the back of her  right leg. She tells me her neurologist told her she has unusually brisk reflexes for the neuropathy, per records she has had an MRI brain, cervical, thoracic spine that were unremarkable. NCS reported as normal. Her B12 level was low and she has been on monthly B12 injections. She has a history of vertigo that significantly improved with vestibular therapy, she was told she has gaze instability, and has intermittent vertigo with positional changes. She denies any headaches, tinnitus, diplopia, dysarthria/dysphagia, neck pain, bowel/bladder dysfunction. She has a history of neurogenic bladder on Detrol, low potassium on daily potassium supplements, and depression/anxiety on Zoloft. She reports a family history of peripheral polyneuropathy in her father and paternal grandfather.   PAST MEDICAL HISTORY: Past Medical History:  Diagnosis Date  . Anxiety   . Depression   . GERD (gastroesophageal reflux disease)   . Gout   . Hyperlipidemia   . Hypoglycemia   . Hypothyroidism   . Neurogenic bladder   . Neuropathy   . Urinary incontinence     MEDICATIONS: Current Outpatient Medications on File Prior to Visit  Medication Sig Dispense Refill  . busPIRone (BUSPAR) 15 MG tablet Take 1 tablet (15 mg total) by mouth 2 (two) times daily. 180 tablet 1  . cyanocobalamin (,VITAMIN B-12,) 1000 MCG/ML injection INJECT 1 ML IM ONCE MONTHLY 10 mL 2  . febuxostat (ULORIC) 40 MG tablet Take 40 mg by mouth daily.    . fexofenadine (ALLEGRA) 180 MG tablet Take 1 tablet (180 mg total) by mouth daily. 90 tablet 1  . levothyroxine (SYNTHROID) 88 MCG tablet  TAKE 1 TABLET BY MOUTH EVERY DAY 90 tablet 0  . omeprazole (PRILOSEC) 20 MG capsule TAKE 1 CAPSULE BY MOUTH 2 TIMES DAILY BEFORE A MEAL 180 capsule 0  . pregabalin (LYRICA) 225 MG capsule TAKE 1 CAPSULE (225 MG TOTAL) BY MOUTH 2 (TWO) TIMES DAILY. 180 capsule 3  . sertraline (ZOLOFT) 100 MG tablet TAKE 1.5 TABLETS BY MOUTH DAILY 135 tablet 1  . tolterodine  (DETROL) 2 MG tablet Take 1 tablet (2 mg total) by mouth 2 (two) times daily. 180 tablet 1   No current facility-administered medications on file prior to visit.    ALLERGIES: Allergies  Allergen Reactions  . Sulfa Antibiotics   . Sulfur     FAMILY HISTORY: Family History  Problem Relation Age of Onset  . Ovarian cancer Mother   . Alcohol abuse Father   . Hypertension Father   . Diabetes type II Father   . Neuropathy Father   . Neuropathy Paternal Grandmother   . Colon cancer Maternal Uncle     SOCIAL HISTORY: Social History   Socioeconomic History  . Marital status: Divorced    Spouse name: Not on file  . Number of children: Not on file  . Years of education: Not on file  . Highest education level: Not on file  Occupational History  . Occupation: Disabled  Tobacco Use  . Smoking status: Former Smoker    Packs/day: 1.00    Years: 30.00    Pack years: 30.00    Types: Cigarettes    Quit date: 05/07/2011    Years since quitting: 8.6  . Smokeless tobacco: Never Used  Vaping Use  . Vaping Use: Never used  Substance and Sexual Activity  . Alcohol use: No  . Drug use: No  . Sexual activity: Not on file  Other Topics Concern  . Not on file  Social History Narrative   Right handed      Highest level of edu- 12th grade      Son lives with pt ( age 25)      Social Determinants of Health   Financial Resource Strain:   . Difficulty of Paying Living Expenses: Not on file  Food Insecurity:   . Worried About Charity fundraiser in the Last Year: Not on file  . Ran Out of Food in the Last Year: Not on file  Transportation Needs:   . Lack of Transportation (Medical): Not on file  . Lack of Transportation (Non-Medical): Not on file  Physical Activity:   . Days of Exercise per Week: Not on file  . Minutes of Exercise per Session: Not on file  Stress:   . Feeling of Stress : Not on file  Social Connections:   . Frequency of Communication with Friends and Family:  Not on file  . Frequency of Social Gatherings with Friends and Family: Not on file  . Attends Religious Services: Not on file  . Active Member of Clubs or Organizations: Not on file  . Attends Archivist Meetings: Not on file  . Marital Status: Not on file  Intimate Partner Violence:   . Fear of Current or Ex-Partner: Not on file  . Emotionally Abused: Not on file  . Physically Abused: Not on file  . Sexually Abused: Not on file     PHYSICAL EXAM: Vitals:   01/14/20 1544  BP: (!) 157/72  Pulse: 73  SpO2: 99%   General: No acute distress Head:  Normocephalic/atraumatic Skin/Extremities: No rash,  no edema Neurological Exam: alert and oriented to person, place, and time. No aphasia or dysarthria. Fund of knowledge is appropriate.  Recent and remote memory are intact.  Attention and concentration are normal.    Cranial nerves: Pupils equal, round. Extraocular movements intact with no nystagmus. Visual fields full. No facial asymmetry. Motor: Bulk and tone normal, muscle strength 5/5 throughout with no pronator drift.  Sensation to light touch, temperature, pin on both UE and LE. Decreased vibration sense to ankles bilaterally. Deep tendon reflexes 2+ throughout.  Finger to nose testing intact.  Gait narrow-based and steady, no ataxia. Romberg test positive   IMPRESSION: This is a very pleasant 64 yo RH woman with a history of hypothyroidism, neurogenic bladder, depression, anxiety, with peripheral polyneuropathy. Her neuropathy has been stable over the years, she is satisfied with response to Lyrica 225mg  BID, refills sent. She will contact our office once ready for PT for back pain. Follow-up in 1 year, she knows to call for any changes.    Thank you for allowing me to participate in her care.  Please do not hesitate to call for any questions or concerns.  Ellouise Newer, M.D.   CC: Dr. Ethlyn Gallery

## 2020-01-17 ENCOUNTER — Other Ambulatory Visit: Payer: Self-pay

## 2020-01-17 ENCOUNTER — Other Ambulatory Visit: Payer: Medicare Other

## 2020-01-17 ENCOUNTER — Telehealth: Payer: Self-pay | Admitting: Family Medicine

## 2020-01-17 DIAGNOSIS — E039 Hypothyroidism, unspecified: Secondary | ICD-10-CM | POA: Diagnosis not present

## 2020-01-17 DIAGNOSIS — E559 Vitamin D deficiency, unspecified: Secondary | ICD-10-CM

## 2020-01-17 DIAGNOSIS — K760 Fatty (change of) liver, not elsewhere classified: Secondary | ICD-10-CM | POA: Diagnosis not present

## 2020-01-17 DIAGNOSIS — Z13220 Encounter for screening for lipoid disorders: Secondary | ICD-10-CM | POA: Diagnosis not present

## 2020-01-17 DIAGNOSIS — E162 Hypoglycemia, unspecified: Secondary | ICD-10-CM

## 2020-01-17 DIAGNOSIS — M109 Gout, unspecified: Secondary | ICD-10-CM | POA: Diagnosis not present

## 2020-01-17 DIAGNOSIS — E538 Deficiency of other specified B group vitamins: Secondary | ICD-10-CM | POA: Diagnosis not present

## 2020-01-17 NOTE — Telephone Encounter (Signed)
Pt came in for her blood work and stated she is needing refills but was not sure if PCP was waiting on her bloodwork before filling  Medications: Levothyroxine Detrol B-12 Omeprazole Buspar Sertraline  Pharmacy: [facility], [place] Phone:  [phone]  Fax:  [phone]

## 2020-01-18 ENCOUNTER — Other Ambulatory Visit: Payer: Self-pay | Admitting: Family Medicine

## 2020-01-18 DIAGNOSIS — E039 Hypothyroidism, unspecified: Secondary | ICD-10-CM

## 2020-01-18 DIAGNOSIS — J3089 Other allergic rhinitis: Secondary | ICD-10-CM

## 2020-01-18 DIAGNOSIS — N319 Neuromuscular dysfunction of bladder, unspecified: Secondary | ICD-10-CM

## 2020-01-18 DIAGNOSIS — F419 Anxiety disorder, unspecified: Secondary | ICD-10-CM

## 2020-01-18 DIAGNOSIS — E538 Deficiency of other specified B group vitamins: Secondary | ICD-10-CM

## 2020-01-18 MED ORDER — FEXOFENADINE HCL 180 MG PO TABS: 180.0000 mg | ORAL_TABLET | Freq: Every day | ORAL | 1 refills | Status: DC

## 2020-01-18 MED ORDER — TOLTERODINE TARTRATE 2 MG PO TABS: 2.0000 mg | ORAL_TABLET | Freq: Two times a day (BID) | ORAL | 1 refills | Status: DC

## 2020-01-18 MED ORDER — OMEPRAZOLE 20 MG PO CPDR
DELAYED_RELEASE_CAPSULE | ORAL | 1 refills | Status: DC
Start: 2020-01-18 — End: 2020-01-19

## 2020-01-18 MED ORDER — BUSPIRONE HCL 15 MG PO TABS: 15.0000 mg | ORAL_TABLET | Freq: Two times a day (BID) | ORAL | 1 refills | Status: DC

## 2020-01-18 MED ORDER — LEVOTHYROXINE SODIUM 100 MCG PO TABS: 100.0000 ug | ORAL_TABLET | Freq: Every day | ORAL | 1 refills | Status: DC

## 2020-01-18 MED ORDER — VITAMIN D (ERGOCALCIFEROL) 1.25 MG (50000 UNIT) PO CAPS: 50000.0000 [IU] | ORAL_CAPSULE | ORAL | 1 refills | Status: DC

## 2020-01-18 MED ORDER — CYANOCOBALAMIN 1000 MCG/ML IJ SOLN: INTRAMUSCULAR | 2 refills | Status: DC

## 2020-01-18 NOTE — Telephone Encounter (Signed)
Noted  

## 2020-01-18 NOTE — Telephone Encounter (Signed)
See result note. I have sent refills and increased levothyroxine.

## 2020-01-19 LAB — CBC WITH DIFFERENTIAL/PLATELET
Absolute Monocytes: 498 cells/uL (ref 200–950)
Basophils Absolute: 69 cells/uL (ref 0–200)
Basophils Relative: 1.1 %
Eosinophils Absolute: 176 cells/uL (ref 15–500)
Eosinophils Relative: 2.8 %
HCT: 42.4 % (ref 35.0–45.0)
Hemoglobin: 13.8 g/dL (ref 11.7–15.5)
Lymphs Abs: 2678 cells/uL (ref 850–3900)
MCH: 28.7 pg (ref 27.0–33.0)
MCHC: 32.5 g/dL (ref 32.0–36.0)
MCV: 88.1 fL (ref 80.0–100.0)
MPV: 11.6 fL (ref 7.5–12.5)
Monocytes Relative: 7.9 %
Neutro Abs: 2879 cells/uL (ref 1500–7800)
Neutrophils Relative %: 45.7 %
Platelets: 178 10*3/uL (ref 140–400)
RBC: 4.81 10*6/uL (ref 3.80–5.10)
RDW: 14 % (ref 11.0–15.0)
Total Lymphocyte: 42.5 %
WBC: 6.3 10*3/uL (ref 3.8–10.8)

## 2020-01-19 LAB — COMPREHENSIVE METABOLIC PANEL
AG Ratio: 1.5 (calc) (ref 1.0–2.5)
ALT: 32 U/L — ABNORMAL HIGH (ref 6–29)
AST: 40 U/L — ABNORMAL HIGH (ref 10–35)
Albumin: 4.1 g/dL (ref 3.6–5.1)
Alkaline phosphatase (APISO): 96 U/L (ref 37–153)
BUN: 10 mg/dL (ref 7–25)
CO2: 30 mmol/L (ref 20–32)
Calcium: 9 mg/dL (ref 8.6–10.4)
Chloride: 103 mmol/L (ref 98–110)
Creat: 0.69 mg/dL (ref 0.50–0.99)
Globulin: 2.7 g/dL (calc) (ref 1.9–3.7)
Glucose, Bld: 104 mg/dL — ABNORMAL HIGH (ref 65–99)
Potassium: 3.9 mmol/L (ref 3.5–5.3)
Sodium: 140 mmol/L (ref 135–146)
Total Bilirubin: 0.5 mg/dL (ref 0.2–1.2)
Total Protein: 6.8 g/dL (ref 6.1–8.1)

## 2020-01-19 LAB — TSH: TSH: 7.29 mIU/L — ABNORMAL HIGH (ref 0.40–4.50)

## 2020-01-19 LAB — HEMOGLOBIN A1C
Hgb A1c MFr Bld: 6.1 % of total Hgb — ABNORMAL HIGH (ref <5.7)
Mean Plasma Glucose: 128 (calc)
eAG (mmol/L): 7.1 (calc)

## 2020-01-19 LAB — VITAMIN B12: Vitamin B-12: 320 pg/mL (ref 200–1100)

## 2020-01-19 LAB — LIPID PANEL
Cholesterol: 308 mg/dL — ABNORMAL HIGH (ref <200)
HDL: 49 mg/dL — ABNORMAL LOW (ref >=50)
LDL Cholesterol (Calc): 214 mg/dL (calc) — ABNORMAL HIGH
Non-HDL Cholesterol (Calc): 259 mg/dL (calc) — ABNORMAL HIGH (ref <130)
Total CHOL/HDL Ratio: 6.3 (calc) — ABNORMAL HIGH (ref <5.0)
Triglycerides: 251 mg/dL — ABNORMAL HIGH (ref <150)

## 2020-01-19 LAB — URIC ACID: Uric Acid, Serum: 5.5 mg/dL (ref 2.5–7.0)

## 2020-01-19 LAB — VITAMIN D 25 HYDROXY (VIT D DEFICIENCY, FRACTURES): Vit D, 25-Hydroxy: 10 ng/mL — ABNORMAL LOW (ref 30–100)

## 2020-01-20 ENCOUNTER — Telehealth: Payer: Self-pay | Admitting: Family Medicine

## 2020-01-20 NOTE — Telephone Encounter (Signed)
See results note. 

## 2020-01-20 NOTE — Telephone Encounter (Signed)
Pt was returning a call back and said she will be by the phone waiting if you get a minute before you leave today

## 2020-02-23 DIAGNOSIS — Z23 Encounter for immunization: Secondary | ICD-10-CM | POA: Diagnosis not present

## 2020-03-29 DIAGNOSIS — Z1231 Encounter for screening mammogram for malignant neoplasm of breast: Secondary | ICD-10-CM | POA: Diagnosis not present

## 2020-03-29 LAB — HM MAMMOGRAPHY

## 2020-04-03 DIAGNOSIS — Z124 Encounter for screening for malignant neoplasm of cervix: Secondary | ICD-10-CM | POA: Diagnosis not present

## 2020-04-03 DIAGNOSIS — Z6837 Body mass index (BMI) 37.0-37.9, adult: Secondary | ICD-10-CM | POA: Diagnosis not present

## 2020-04-03 DIAGNOSIS — N9089 Other specified noninflammatory disorders of vulva and perineum: Secondary | ICD-10-CM | POA: Diagnosis not present

## 2020-04-11 DIAGNOSIS — M1A09X0 Idiopathic chronic gout, multiple sites, without tophus (tophi): Secondary | ICD-10-CM | POA: Diagnosis not present

## 2020-04-26 ENCOUNTER — Telehealth (INDEPENDENT_AMBULATORY_CARE_PROVIDER_SITE_OTHER): Payer: Medicare Other | Admitting: Family Medicine

## 2020-04-26 DIAGNOSIS — E039 Hypothyroidism, unspecified: Secondary | ICD-10-CM | POA: Diagnosis not present

## 2020-04-26 DIAGNOSIS — M2550 Pain in unspecified joint: Secondary | ICD-10-CM | POA: Diagnosis not present

## 2020-04-26 DIAGNOSIS — R739 Hyperglycemia, unspecified: Secondary | ICD-10-CM | POA: Diagnosis not present

## 2020-04-26 DIAGNOSIS — E559 Vitamin D deficiency, unspecified: Secondary | ICD-10-CM | POA: Insufficient documentation

## 2020-04-26 DIAGNOSIS — E785 Hyperlipidemia, unspecified: Secondary | ICD-10-CM | POA: Diagnosis not present

## 2020-04-26 DIAGNOSIS — R350 Frequency of micturition: Secondary | ICD-10-CM

## 2020-04-26 MED ORDER — ROSUVASTATIN CALCIUM 5 MG PO TABS: 2.5000 mg | ORAL_TABLET | ORAL | 2 refills | Status: DC

## 2020-04-26 NOTE — Progress Notes (Signed)
Virtual Visit via Video Note  I connected with Samantha Burch  on 04/27/20 at  1:30 PM EST by a video enabled telemedicine application and verified that I am speaking with the correct person using two identifiers.  Location patient: home Location provider: [place], [place] [address] Persons participating in the virtual visit: patient, provider  I discussed the limitations of evaluation and management by telemedicine and the availability of in person appointments. The patient expressed understanding and agreed to proceed.   Samantha Burch DOB: Jan 13, 1956 Encounter date: 04/26/2020  This is a 64 y.o. female who presents for review of labs for her high blood sugar, high cholesterol.   History of present illness: Virtual visit scheduled to discuss lab results from 01/17/20;   She has had a lot of stress going on in life. She just wants to do better. She would like to meet with nutritionist to better understand what she needs to do. At night feeling hot, clammy. Feels like blood sugar is low - has to urinate frequently.   Quit taking lipitor in past due to pain in calves. Calves are hurting right now; have been hurting for months. No swelling in legs. Does wear compression stockings. Varicose veins are bad.   Arthritis in fingers is limiting; hard to get on compression stockings.   Lipid Panel     Component Value Date/Time   CHOL 308 (H) 01/17/2020 1122   TRIG 251 (H) 01/17/2020 1122   HDL 49 (L) 01/17/2020 1122   CHOLHDL 6.3 (H) 01/17/2020 1122   LDLCALC 214 (H) 01/17/2020 1122   A1C: 6.1 AST/ALT mildly elevated at 40/32 respectively. TSH elevated 7.29. we increased dose of synthroid to 120mcg after labwork. Didn't note large difference with this. Still with zero energy level.  Vitamin D deficient at 60. Started once weekly replacement, but didn't note difference with this.   Back hurts, calves hurt.  Calves bother her more at  rest.    Allergies  Allergen Reactions  . Sulfa Antibiotics   . Sulfur    No outpatient medications have been marked as taking for the 04/26/20 encounter (Video Visit) with Caren Macadam, MD.    Review of Systems  Constitutional: Positive for fatigue. Negative for chills and fever.  Respiratory: Negative for cough, chest tightness, shortness of breath and wheezing.   Cardiovascular: Negative for chest pain, palpitations and leg swelling.  Musculoskeletal: Positive for arthralgias.    Objective:  LMP 05/06/2006 (Approximate)       BP Readings from Last 3 Encounters:  01/14/20 (!) 157/72  09/22/19 132/69  07/30/19 (!) 174/92   Wt Readings from Last 3 Encounters:  01/14/20 202 lb 12.8 oz (92 kg)  01/12/19 204 lb (92.5 kg)  03/27/18 205 lb (93 kg)    EXAM:  GENERAL: Sounds alert, oriented, well and in no acute distress LUNGS: no signs of respiratory distress, breathing rate appears normal, no obvious gross SOB, gasping or wheezing PSYCH/NEURO: pleasant and cooperative, she does have baseline anxiety and depression, which are present.    Assessment/Plan  1. Hyperlipidemia, unspecified hyperlipidemia type She is willing to start statin medication to try and lower her cholesterol.  She did have muscle cramps with pravastatin before, so I advised her to hold off until were able to get her feeling better.  I would like to make sure she has no active urinary tract infection before we start her on this medication. - rosuvastatin (CRESTOR) 5  MG tablet; Take 0.5 tablets (2.5 mg total) by mouth 3 (three) times a week.  Dispense: 30 tablet; Refill: 2  2. Hyperglycemia We discussed low carbohydrate eating and trying to limit carbohydrates to 3 carb choices or less per meal.  I am going to put handout upfront for her to review.  I am also referring her to nutrition which she requested. - Hemoglobin A1c; Future - Amb Referral to Nutrition and Diabetic E  3. Urinary frequency -  Urinalysis with Culture Reflex; Future  4. Vitamin D deficiency She has not noted significant improvement since starting vitamin D.  She is taking this for 3 months, would like to recheck levels. - VITAMIN D 25 Hydroxy (Vit-D Deficiency, Fractures); Future  5. Acquired hypothyroidism Synthroid dose adjusted 3 months ago.  We will recheck to make sure she is on track with treatment. - TSH; Future - T4, free; Future  6. Arthralgia, unspecified joint She has a lot of aches and pains and this is limiting for her physically.  I am going to check some additional blood work to make sure no autoimmune component. - ANA; Future - Sedimentation rate; Future - C-reactive protein; Future  We discussed having goals for being healthier.  She feels overwhelmed by all of the changes she needs to make to be healthier (improve diet, regular exercise, etc.).  We discussed starting with one simple thing.  Her goal is to walk for 5 minutes daily.  She does have a treadmill she is able to do this on.  We discussed that this is a great first step visit will help all of her medical comorbidities.   I discussed the assessment and treatment plan with the patient. The patient was provided an opportunity to ask questions and all were answered. The patient agreed with the plan and demonstrated an understanding of the instructions.   The patient was advised to call back or seek an in-person evaluation if the symptoms worsen or if the condition fails to improve as anticipated.  I provided 35 minutes of non-face-to-face time during this encounter.   Micheline Rough, MD

## 2020-05-02 ENCOUNTER — Telehealth: Payer: Self-pay | Admitting: *Deleted

## 2020-05-02 NOTE — Telephone Encounter (Signed)
Left a detailed message at the pts cell number to call for a lab appt as below.  Diabetes diet left at the front desk with note attached also.

## 2020-05-02 NOTE — Telephone Encounter (Signed)
-----   Message from Wynn Banker, MD sent at 04/26/2020  2:17 PM EST ----- Can you set up lab visit for her and then put up front for her my diabetic handout there for her. Please write on top to limit 3 carb choices/meal or less and that 1 carb choice = 15g of carbohydrates.

## 2020-05-04 NOTE — Addendum Note (Signed)
Addended by: Lerry Liner on: 05/04/2020 01:47 PM   Modules accepted: Orders

## 2020-05-09 ENCOUNTER — Other Ambulatory Visit: Payer: Self-pay

## 2020-05-09 ENCOUNTER — Other Ambulatory Visit (INDEPENDENT_AMBULATORY_CARE_PROVIDER_SITE_OTHER): Payer: Medicare Other

## 2020-05-09 DIAGNOSIS — R739 Hyperglycemia, unspecified: Secondary | ICD-10-CM

## 2020-05-09 DIAGNOSIS — E559 Vitamin D deficiency, unspecified: Secondary | ICD-10-CM

## 2020-05-09 DIAGNOSIS — R350 Frequency of micturition: Secondary | ICD-10-CM

## 2020-05-09 DIAGNOSIS — M2550 Pain in unspecified joint: Secondary | ICD-10-CM

## 2020-05-09 DIAGNOSIS — E039 Hypothyroidism, unspecified: Secondary | ICD-10-CM

## 2020-05-09 LAB — T4, FREE: Free T4: 0.98 ng/dL (ref 0.60–1.60)

## 2020-05-09 LAB — HEMOGLOBIN A1C: Hgb A1c MFr Bld: 6.3 % (ref 4.6–6.5)

## 2020-05-09 LAB — C-REACTIVE PROTEIN: CRP: <1 mg/dL (ref 0.5–20.0)

## 2020-05-09 LAB — SEDIMENTATION RATE: Sed Rate: 13 mm/hr (ref 0–30)

## 2020-05-09 LAB — VITAMIN D 25 HYDROXY (VIT D DEFICIENCY, FRACTURES): VITD: 42.06 ng/mL (ref 30.00–100.00)

## 2020-05-09 LAB — TSH: TSH: 2.15 u[IU]/mL (ref 0.35–4.50)

## 2020-05-11 LAB — URINALYSIS W MICROSCOPIC + REFLEX CULTURE
Bacteria, UA: NONE SEEN /HPF
Bilirubin Urine: NEGATIVE
Glucose, UA: NEGATIVE
Hgb urine dipstick: NEGATIVE
Hyaline Cast: NONE SEEN /LPF
Ketones, ur: NEGATIVE
Nitrites, Initial: NEGATIVE
Protein, ur: NEGATIVE
RBC / HPF: NONE SEEN /HPF (ref 0–2)
Specific Gravity, Urine: 1.004 (ref 1.001–1.03)
Squamous Epithelial / HPF: NONE SEEN /HPF (ref <=5)
pH: 6.5 (ref 5.0–8.0)

## 2020-05-11 LAB — URINE CULTURE
MICRO NUMBER:: 11381402
SPECIMEN QUALITY:: ADEQUATE

## 2020-05-11 LAB — ANA: Anti Nuclear Antibody (ANA): NEGATIVE

## 2020-05-11 LAB — CULTURE INDICATED

## 2020-05-18 ENCOUNTER — Encounter: Payer: Self-pay | Admitting: Family Medicine

## 2020-06-22 ENCOUNTER — Telehealth: Payer: Self-pay | Admitting: Family Medicine

## 2020-06-22 NOTE — Telephone Encounter (Signed)
Left message for patient to call back and schedule Medicare Annual Wellness Visit (AWV) either virtually or in office. No detailed message left   Last AWVI no information  please schedule at anytime with [facility] Nurse Health Advisor 1 or 2   This should be a 45 minute visit.

## 2020-06-23 ENCOUNTER — Encounter: Payer: Medicare Other | Attending: Family Medicine | Admitting: Registered"

## 2020-06-23 DIAGNOSIS — R7303 Prediabetes: Secondary | ICD-10-CM | POA: Diagnosis not present

## 2020-06-29 ENCOUNTER — Encounter: Payer: Self-pay | Admitting: Registered"

## 2020-06-29 NOTE — Progress Notes (Signed)
On 06/23/20 patient completed Core Session 1 of Diabetes Prevention Program course virtually with [facility]. The following learning objectives were met by the patient during this class:   Virtual Visit via Video Note  I connected with Samantha Burch by a video enabled application and verified that I am speaking with the correct person.  Location: Patient: Home.  Provider: Office.     Learning Objectives:   Be able to explain the purpose and benefits of the National Diabetes Prevention Program.   Be able to describe the events that will take place at every session.   Know the weight loss and physical activity goals established by the [facility] Diabetes Prevention Program.   Know their own individual weight loss and physical activity goals.   Be able to explain the important effect of self-monitoring on behavior change.   Goals:  . Record food and beverage intake in "Food and Activity Tracker" over the next week.  . E-mail completed "Food and Activity Tracker" to Lifestyle Coach next week before session 2. . Circle the foods or beverages you think are highest in fat and calories in your food tracker. . Read the labels on the food you buy, and consider using measuring cups and spoons to help you calculate the amount you eat. We will talk about measuring in more detail in the coming weeks.   Follow-Up Plan:  Attend Core Session 2 next week.   E-mail completed "Food and Activity Tracker" to Lifestyle Coach next week before class.

## 2020-06-30 ENCOUNTER — Encounter: Payer: Medicare Other | Attending: Family Medicine | Admitting: Registered"

## 2020-06-30 DIAGNOSIS — R7303 Prediabetes: Secondary | ICD-10-CM

## 2020-07-03 ENCOUNTER — Encounter: Payer: Self-pay | Admitting: Registered"

## 2020-07-03 NOTE — Progress Notes (Addendum)
On 06/30/20 patient completed Core Session 2 of Diabetes Prevention Program course virtually with [facility]. The following learning objectives were met by the patient during this class:   Virtual Visit via Video Note  I connected with Samantha Burch on 06/30/20 at  3:30 PM EST by a video enabled application and verified that I am speaking with the correct person using two identifiers.  Location: Patient: Home.  Provider: Office.   Learning Objectives:  Self-monitor their weight during the weeks following Session 2.   Describe the relationship between fat and calories.   Explain the reason for, and basic principles of, self-monitoring fat grams and calories.   Identify their personal fat gram goals.   Use the ?Fat and Calorie Counter to calculate the calories and fat grams of a given selection of foods.   Keep a running total of the fat grams they eat each day.   Calculate fat, calories, and serving sizes from nutrition labels.   Goals:   Weigh yourself at the same time each day, or every few days, and record your weight in your Food and Activity Tracker.  Write down everything you eat and drink in your Food and Activity Tracker.  Measure portions as much as you can, and start reading labels.   Use the ?Fat and Calorie Counter to figure out the amount of fat and calories in what you ate, and write the amount down in your Food and Activity Tracker.  Keep a running fat gram total throughout the day. Come as close to your fat gram goal as you can.   Follow-Up Plan:  Attend Core Session 3 next week.   Email completed  "Food and Activity Tracker" to Lifestyle Coach next week.

## 2020-07-07 ENCOUNTER — Encounter: Payer: Medicare Other | Attending: Family Medicine | Admitting: Registered"

## 2020-07-07 DIAGNOSIS — R7303 Prediabetes: Secondary | ICD-10-CM | POA: Insufficient documentation

## 2020-07-10 DIAGNOSIS — Z6837 Body mass index (BMI) 37.0-37.9, adult: Secondary | ICD-10-CM | POA: Diagnosis not present

## 2020-07-10 DIAGNOSIS — E669 Obesity, unspecified: Secondary | ICD-10-CM | POA: Diagnosis not present

## 2020-07-10 DIAGNOSIS — M2550 Pain in unspecified joint: Secondary | ICD-10-CM | POA: Diagnosis not present

## 2020-07-10 DIAGNOSIS — M1A09X0 Idiopathic chronic gout, multiple sites, without tophus (tophi): Secondary | ICD-10-CM | POA: Diagnosis not present

## 2020-07-10 DIAGNOSIS — R7989 Other specified abnormal findings of blood chemistry: Secondary | ICD-10-CM | POA: Diagnosis not present

## 2020-07-12 ENCOUNTER — Telehealth: Payer: Self-pay | Admitting: Family Medicine

## 2020-07-12 ENCOUNTER — Other Ambulatory Visit: Payer: Self-pay | Admitting: Family Medicine

## 2020-07-12 DIAGNOSIS — E785 Hyperlipidemia, unspecified: Secondary | ICD-10-CM

## 2020-07-12 DIAGNOSIS — R739 Hyperglycemia, unspecified: Secondary | ICD-10-CM

## 2020-07-12 DIAGNOSIS — F32A Depression, unspecified: Secondary | ICD-10-CM

## 2020-07-12 DIAGNOSIS — R748 Abnormal levels of other serum enzymes: Secondary | ICD-10-CM

## 2020-07-12 DIAGNOSIS — F419 Anxiety disorder, unspecified: Secondary | ICD-10-CM

## 2020-07-12 NOTE — Telephone Encounter (Signed)
Pt called to see if Dr. Ethlyn Gallery would like for her to do labs she has a virtual appointment on August 04, 2020 @11 :30, but wanted to know if she is needing any lab work done and if so she would like to make an appointment to do.   Pt would like to have a call back.

## 2020-07-12 NOTE — Telephone Encounter (Signed)
Labs ordered; please set up visit. I'm not checking everything, but we will recheck some of the abnormals from before and LIPIDS to see how cholesterol med is doing.

## 2020-07-12 NOTE — Telephone Encounter (Signed)
Left a message for the patient to return my call.  

## 2020-07-13 ENCOUNTER — Encounter: Payer: Self-pay | Admitting: Registered"

## 2020-07-13 NOTE — Progress Notes (Signed)
On 07/07/20 patient completed Core Session 3 of Diabetes Prevention Program course virtually with [facility]. The following learning objectives were met by the patient during this class:   Virtual Visit via Video Note  I connected with [name] on 07/07/20 at  3:30 PM EST by a video enabled application and verified that I am speaking with the correct person using two identifiers.  Location: Patient: Home.  Provider: Office.    Learning Objectives:  Weigh and measure foods.  Estimate the fat and calorie content of common foods.  Describe three ways to eat less fat and fewer calories.  Create a plan to eat less fat for the following week.   Goals:   Track weight when weighing outside of class.   Track food and beverages eaten each day in Food and Activity Tracker and include fat grams and calories for each.   Try to stay within fat gram goal.   Complete plan for eating less high fat foods and answer related homework questions.    Follow-Up Plan:  Attend Core Session 4 next week.   Bring completed "Food and Activity Tracker" next week to be reviewed by Lifestyle Coach.

## 2020-07-14 ENCOUNTER — Other Ambulatory Visit: Payer: Self-pay | Admitting: Family Medicine

## 2020-07-14 ENCOUNTER — Other Ambulatory Visit: Payer: Self-pay | Admitting: Neurology

## 2020-07-14 ENCOUNTER — Encounter (HOSPITAL_BASED_OUTPATIENT_CLINIC_OR_DEPARTMENT_OTHER): Payer: Medicare Other | Admitting: Registered"

## 2020-07-14 DIAGNOSIS — E039 Hypothyroidism, unspecified: Secondary | ICD-10-CM

## 2020-07-14 DIAGNOSIS — R7303 Prediabetes: Secondary | ICD-10-CM

## 2020-07-14 DIAGNOSIS — G609 Hereditary and idiopathic neuropathy, unspecified: Secondary | ICD-10-CM

## 2020-07-14 DIAGNOSIS — J3089 Other allergic rhinitis: Secondary | ICD-10-CM

## 2020-07-17 ENCOUNTER — Encounter: Payer: Self-pay | Admitting: Registered"

## 2020-07-17 NOTE — Progress Notes (Signed)
On 07/14/20 patient completed Core Session 4 of Diabetes Prevention Program course virtually with [facility]. The following learning objectives were met by the patient during this class:   Virtual Visit via Video Note  I connected with Samantha Burch on 07/14/20 at  3:30 PM EST by a video enabled telemedicine application and verified that I am speaking with the correct person using two identifiers.  Location: Patient: Home.  Provider: Office.     Learning Objectives:  Describe the MyPlate food guide and its recommendations, including how to reduce fat and calories in our diet.  Compare and contrast MyPlate guidelines with participants' eating habits.  List ways to replace high-fat and high-calorie foods with low-fat and low-calorie foods.  Explain the importance of eating plenty of whole grains, vegetables, and fruits, while staying within fat gram goals.  Explain the importance of eating foods from all groups of MyPlate and of eating a variety of foods from within each group.  Explain why a balanced diet is beneficial to health.  Goals:   Record weight taken outside of class.   Track foods and beverages eaten each day in the "Food and Activity Tracker," including calories and fat grams for each item.   Practice comparing what you eat with the recommendations of MyPlate using the "Rate Your Plate" handout.   Complete the "Rate Your Plate" handout form on at least 3 days.   Answer homework questions.   Follow-Up Plan:  Attend Core Session 5 next week.   Email completed "Food and Activity Tracker" next week to be reviewed by Lifestyle Coach.

## 2020-07-18 NOTE — Telephone Encounter (Signed)
Patient informed of the message below.  Patient stated she was not aware she was to be taking a cholesterol medication and I advised the pt the Rx was sent to the pharmacy during the virtual visit from December.  Patient agreed to contact the pharmacy and begin taking Crestor.  Message sent to PCP.

## 2020-07-19 ENCOUNTER — Other Ambulatory Visit: Payer: Self-pay | Admitting: Family Medicine

## 2020-07-21 ENCOUNTER — Encounter (HOSPITAL_BASED_OUTPATIENT_CLINIC_OR_DEPARTMENT_OTHER): Payer: Medicare Other | Admitting: Registered"

## 2020-07-21 ENCOUNTER — Encounter: Payer: Self-pay | Admitting: Registered"

## 2020-07-21 DIAGNOSIS — R7303 Prediabetes: Secondary | ICD-10-CM

## 2020-07-21 NOTE — Progress Notes (Signed)
On 07/21/20 patient completed Core Session 5 of Diabetes Prevention Program course virtually with [facility]. The following learning objectives were met by the patient during this class:   Virtual Visit via Video Note  I connected with Samantha Burch on 07/21/20 at  3:30 PM EDT by a video enabled application and verified that I am speaking with the correct person using two identifiers.  Location: Patient: Home.  Provider: Office.   Learning Objectives:  Establish a physical activity goal.  Explain the importance of the physical activity goal.  Describe their current level of physical activity.  Name ways that they are already physically active.  Develop personal plans for physical activity for the next week.   Goals:   Record weight taken outside of class.   Track foods and beverages eaten each day in the "Food and Activity Tracker," including calories and fat grams for each item.   Make an Activity Plan including date, specific type of activity, and length of time you plan to be active that includes at last 60 minutes of activity for the week.   Track activity type, minutes you were active, and distance you reached each day in the "Food and Activity Tracker."   Follow-Up Plan: . Attend Core Session 6 next week.  . E-mail completed "Food and Activity Tracker" to Lifestyle Coach next week before class

## 2020-07-27 ENCOUNTER — Other Ambulatory Visit (INDEPENDENT_AMBULATORY_CARE_PROVIDER_SITE_OTHER): Payer: Medicare Other

## 2020-07-27 ENCOUNTER — Other Ambulatory Visit: Payer: Self-pay

## 2020-07-27 DIAGNOSIS — E785 Hyperlipidemia, unspecified: Secondary | ICD-10-CM

## 2020-07-27 DIAGNOSIS — R748 Abnormal levels of other serum enzymes: Secondary | ICD-10-CM

## 2020-07-27 DIAGNOSIS — R739 Hyperglycemia, unspecified: Secondary | ICD-10-CM

## 2020-07-27 LAB — COMPREHENSIVE METABOLIC PANEL
ALT: 19 U/L (ref 0–35)
AST: 22 U/L (ref 0–37)
Albumin: 4.3 g/dL (ref 3.5–5.2)
Alkaline Phosphatase: 77 U/L (ref 39–117)
BUN: 13 mg/dL (ref 6–23)
CO2: 31 mEq/L (ref 19–32)
Calcium: 9.1 mg/dL (ref 8.4–10.5)
Chloride: 101 mEq/L (ref 96–112)
Creatinine, Ser: 0.81 mg/dL (ref 0.40–1.20)
GFR: 76.87 mL/min (ref >60.00)
Glucose, Bld: 91 mg/dL (ref 70–99)
Potassium: 3.8 mEq/L (ref 3.5–5.1)
Sodium: 142 mEq/L (ref 135–145)
Total Bilirubin: 0.6 mg/dL (ref 0.2–1.2)
Total Protein: 6.7 g/dL (ref 6.0–8.3)

## 2020-07-27 LAB — LIPID PANEL
Cholesterol: 302 mg/dL — ABNORMAL HIGH (ref 0–200)
HDL: 43.5 mg/dL (ref >39.00)
NonHDL: 258.01
Total CHOL/HDL Ratio: 7
Triglycerides: 210 mg/dL — ABNORMAL HIGH (ref 0.0–149.0)
VLDL: 42 mg/dL — ABNORMAL HIGH (ref 0.0–40.0)

## 2020-07-27 LAB — GAMMA GT: GGT: 27 U/L (ref 7–51)

## 2020-07-27 LAB — LDL CHOLESTEROL, DIRECT: Direct LDL: 217 mg/dL

## 2020-07-27 LAB — HEMOGLOBIN A1C: Hgb A1c MFr Bld: 6.3 % (ref 4.6–6.5)

## 2020-07-28 ENCOUNTER — Encounter (HOSPITAL_BASED_OUTPATIENT_CLINIC_OR_DEPARTMENT_OTHER): Payer: Medicare Other | Admitting: Registered"

## 2020-07-28 DIAGNOSIS — R7303 Prediabetes: Secondary | ICD-10-CM

## 2020-08-02 ENCOUNTER — Encounter: Payer: Self-pay | Admitting: Registered"

## 2020-08-02 NOTE — Progress Notes (Signed)
On 07/28/20 patient completed Core Session 6 of Diabetes Prevention Program course virtually with [facility]. The following learning objectives were met by the patient during this class:   Virtual Visit via Video Note  I connected with Samantha Burch on 07/28/20 at  3:30 PM EDT by a video enabled application and verified that I am speaking with the correct person using two identifiers.  Location: Patient: Home.  Provider: Office.   Learning Objectives:  Graph their daily physical activity.   Describe two ways of finding the time to be active.   Define "lifestyle activity."   Describe how to prevent injury.   Develop an activity plan for the coming week.   Goals:   Record weight taken outside of class.   Track foods and beverages eaten each day in the "Food and Activity Tracker," including calories and fat grams for each item.    Track activity type, minutes you were active, and distance you reached each day in the "Food and Activity Tracker."   Set aside one 20 to 30-minute block of time every day or find two or more periods of 10 to15 minutes each for physical activity.   Warm up, cool down, and stretch.  Make a Physical Activities Plan for the Week.   Follow-Up Plan:  Attend Core Session 7 next week.   E-mail completed "Food and Activity Tracker" to Lifestyle Coach next week before class

## 2020-08-03 ENCOUNTER — Telehealth: Payer: Self-pay | Admitting: Family Medicine

## 2020-08-03 NOTE — Telephone Encounter (Signed)
Pt is returning the call to the office and stated that she will have the phone by her side

## 2020-08-04 ENCOUNTER — Telehealth (INDEPENDENT_AMBULATORY_CARE_PROVIDER_SITE_OTHER): Payer: Medicare Other | Admitting: Family Medicine

## 2020-08-04 ENCOUNTER — Encounter: Payer: Medicare Other | Attending: Family Medicine | Admitting: Registered"

## 2020-08-04 ENCOUNTER — Encounter: Payer: Self-pay | Admitting: Family Medicine

## 2020-08-04 ENCOUNTER — Other Ambulatory Visit: Payer: Self-pay

## 2020-08-04 VITALS — Ht 61.0 in | Wt 197.6 lb

## 2020-08-04 DIAGNOSIS — R7303 Prediabetes: Secondary | ICD-10-CM | POA: Insufficient documentation

## 2020-08-04 DIAGNOSIS — F339 Major depressive disorder, recurrent, unspecified: Secondary | ICD-10-CM

## 2020-08-04 DIAGNOSIS — R739 Hyperglycemia, unspecified: Secondary | ICD-10-CM

## 2020-08-04 DIAGNOSIS — F419 Anxiety disorder, unspecified: Secondary | ICD-10-CM | POA: Diagnosis not present

## 2020-08-04 DIAGNOSIS — E785 Hyperlipidemia, unspecified: Secondary | ICD-10-CM

## 2020-08-04 DIAGNOSIS — E039 Hypothyroidism, unspecified: Secondary | ICD-10-CM

## 2020-08-04 MED ORDER — SHINGRIX 50 MCG/0.5ML IM SUSR: 0.5000 mL | Freq: Once | INTRAMUSCULAR | 0 refills | Status: AC

## 2020-08-04 NOTE — Progress Notes (Signed)
Virtual Visit via Video Note  I connected with Samantha Burch  on 08/04/20 at 11:30 AM EDT by a video enabled telemedicine application and verified that I am speaking with the correct person using two identifiers.  Location patient: home Location provider: [place], [place] [address] Persons participating in the virtual visit: patient, provider  I discussed the limitations of evaluation and management by telemedicine and the availability of in person appointments. The patient expressed understanding and agreed to proceed.   Samantha Burch DOB: [birth year] Encounter date: 08/04/2020  This is a 65 y.o. female who presents with No chief complaint on file.   History of present illness: She is doing weekly online diabetic classes - she states that this is helpful for her. States that timing good for this. She knows she needs to make changes, so this helped her. She is tracking what she is eating. She is tying in what she is eating to how she is feeling. The planning process of this has been helpful for her rather than just giving in to emotions. Accountability has been good for her. 2 weeks ago they also talked about activity and this really hit home with her. She is also working on this aspect now and realizes importance of this to work with her healthy eating. She is working on slowly building to 148minutes/week. She did lose weight this week. Last weight was 197.6.   *hadn't been taking cholesterol medication since we started it; she has it now. Rosuvastatin 5mg  tablet she is doing 1/2 tablet 3 times/week. She has been doing it just for a couple of weeks at this point.   *she states that life is in [place]. Son is started at [employer] in the fall; transferred from community college. He is currently taking driving lessons. Mom is too anxious to have helped him with driving. Her anxiety has limited them significantly.   Allergies  Allergen Reactions   . Elemental Sulfur   . Sulfa Antibiotics    No outpatient medications have been marked as taking for the 08/04/20 encounter (Appointment) with Caren Macadam, MD.    Review of Systems  Constitutional: Negative for chills, fatigue and fever.  Respiratory: Negative for cough, chest tightness, shortness of breath and wheezing.   Cardiovascular: Negative for chest pain, palpitations and leg swelling.    Objective:  LMP 05/06/2006 (Approximate)       BP Readings from Last 3 Encounters:  01/14/20 (!) 157/72  09/22/19 132/69  07/30/19 (!) 174/92   Wt Readings from Last 3 Encounters:  01/14/20 202 lb 12.8 oz (92 kg)  01/12/19 204 lb (92.5 kg)  03/27/18 205 lb (93 kg)    EXAM:  GENERAL: alert, oriented, appears well and in no acute distress  HEENT: atraumatic, conjunctiva clear, no obvious abnormalities on inspection of external nose and ears  NECK: normal movements of the head and neck  LUNGS: on inspection no signs of respiratory distress, breathing rate appears normal, no obvious gross SOB, gasping or wheezing  CV: no obvious cyanosis  MS: moves all visible extremities without noticeable abnormality  PSYCH/NEURO: pleasant and cooperative; always high level of anxiety. Really working on making personal progress and shifting focus to work on healthy behaviors that will help her feel better.    Assessment/Plan  1. Hyperglycemia She has made multiple changes in how she is thinking about eating, she has even lost a couple of pounds. She is working with this prediabetic  class to think through all she can do and to track her eating and activity level. She is having some change with her thinking and wanting to work on herself. Keep up good work! - Hemoglobin A1c; Future  2. Acquired hypothyroidism Has been stable on current medication.   3. Hyperlipidemia, unspecified hyperlipidemia type She is tolerating 2.5mg  crestor three times weekly x 2 weeks now. Encouraged either  increase to 5mg  three times weekly or 2.5mg  daily with recheck labs in 3 mo time.  - Lipid panel; Future  4. Depression, recurrent ([place]) Stable.  5. Anxiety Always present; but stable. She is working on small changes to help get her back in more active life.   Followup: labs, visit 3-4 months.   I discussed the assessment and treatment plan with the patient. The patient was provided an opportunity to ask questions and all were answered. The patient agreed with the plan and demonstrated an understanding of the instructions.   The patient was advised to call back or seek an in-person evaluation if the symptoms worsen or if the condition fails to improve as anticipated.  I provided 25 minutes of non-face-to-face time during this encounter.   Micheline Rough, MD

## 2020-08-04 NOTE — Telephone Encounter (Signed)
Tried to call patient, but the mailbox is full.  See lab result note.

## 2020-08-08 ENCOUNTER — Telehealth: Payer: Self-pay | Admitting: *Deleted

## 2020-08-08 NOTE — Telephone Encounter (Signed)
Spoke with the pt and scheduled appts as below.

## 2020-08-08 NOTE — Telephone Encounter (Signed)
Spoke with the pt to schedule lab and follow up appts per PCP.  Patient stated she received a message today from [place] stating she was a no-show for the visit on 4/1, when she actually had a virtual visit with the PCP and does not want to be charged a fee.  Message sent to practice administrator.

## 2020-08-08 NOTE — Telephone Encounter (Signed)
-----   Message from Caren Macadam, MD sent at 08/04/2020 12:31 PM EDT ----- Please set up follow up lab visit in 3-4 months followed by office visit (in person or virtual ok.

## 2020-08-11 ENCOUNTER — Encounter (HOSPITAL_BASED_OUTPATIENT_CLINIC_OR_DEPARTMENT_OTHER): Payer: Medicare Other | Admitting: Registered"

## 2020-08-11 ENCOUNTER — Encounter: Payer: Self-pay | Admitting: Registered"

## 2020-08-11 DIAGNOSIS — Z713 Dietary counseling and surveillance: Secondary | ICD-10-CM | POA: Diagnosis not present

## 2020-08-11 DIAGNOSIS — R7303 Prediabetes: Secondary | ICD-10-CM

## 2020-08-11 NOTE — Progress Notes (Signed)
On 08/04/20 patient completed Core Session 7 of Diabetes Prevention Program course virtually with [facility]. The following learning objectives were met by the patient during this class:   Virtual Visit via Video Note  I connected with Samantha Burch on 08/04/20 at  3:30 PM EDT by a video enabled application and verified that I am speaking with the correct person using two identifiers.  Location: Patient: Home.  Provider: Office.   Learning Objectives:  Define calorie balance.  Explain how healthy eating and being active are related in terms of calorie balance.   Describe the relationship between calorie balance and weight loss.   Describe his or her progress as it relates to calorie balance.   Develop an activity plan for the coming week.   Goals:   Record weight taken outside of class.   Track foods and beverages eaten each day in the "Food and Activity Tracker," including calories and fat grams for each item.    Track activity type, minutes you were active, and distance you reached each day in the "Food and Activity Tracker."   Set aside one 20 to 30-minute block of time every day or find two or more periods of 10 to15 minutes each for physical activity.   Make a Physical Activities Plan for the Week.   Make active lifestyle choices all through the day   Stay at or go slightly over activity goal.   Follow-Up Plan:  Attend Core Session 8 next week.   E-mail completed "Food and Activity Tracker" to Lifestyle Coach next week before class

## 2020-08-14 ENCOUNTER — Encounter: Payer: Self-pay | Admitting: Registered"

## 2020-08-14 NOTE — Progress Notes (Signed)
On 08/11/20 patient completed Core Session 8 of Diabetes Prevention Program course virtually with [facility]. The following learning objectives were met by the patient during this class:   Virtual Visit via Video Note  I connected with [name] by a video enabled application and verified that I am speaking with the correct person using two identifiers.  Location: Patient: Home.  Provider: Office.   Learning Objectives:  Recognize positive and negative food and activity cues.   Change negative food and activity cues to positive cues.   Add positive cues for activity and eliminate cues for inactivity.   Develop a plan for removing one problem food cue for the coming week.   Goals:   Record weight taken outside of class.   Track foods and beverages eaten each day in the "Food and Activity Tracker," including calories and fat grams for each item.    Track activity type, minutes you were active, and distance you reached each day in the "Food and Activity Tracker."   Set aside one 20 to 30-minute block of time every day or find two or more periods of 10 to15 minutes each for physical activity.   Remove one problem food cue.   Add one positive cue for being more active.  Follow-Up Plan: . Attend Core Session 9.  . Email completed "Food and Activity Tracker" next week to be reviewed by Lifestyle Coach.

## 2020-08-17 DIAGNOSIS — Z23 Encounter for immunization: Secondary | ICD-10-CM | POA: Diagnosis not present

## 2020-08-25 ENCOUNTER — Encounter: Payer: Self-pay | Admitting: Registered"

## 2020-08-25 ENCOUNTER — Ambulatory Visit (HOSPITAL_BASED_OUTPATIENT_CLINIC_OR_DEPARTMENT_OTHER): Payer: Medicare Other | Admitting: Registered"

## 2020-08-25 DIAGNOSIS — R7303 Prediabetes: Secondary | ICD-10-CM

## 2020-08-25 NOTE — Progress Notes (Signed)
On 08/25/20 patient completed Core Session 9 of Diabetes Prevention Program course virtually with [facility]. The following learning objectives were met by the patient during this class:   Virtual Visit via Video Note  I connected with Samantha Burch by a video enabled application and verified that I am speaking with the correct person using two identifiers.  Location: Patient: Home.  Provider: Office.  Learning Objectives:  List and describe five steps to problem solving.   Apply the five problem solving steps to resolve a problem he or she has with eating less fat and fewer calories or being more active.   Goals:   Record weight taken outside of class.   Track foods and beverages eaten each day in the "Food and Activity Tracker," including calories and fat grams for each item.    Track activity type, minutes you were active, and distance you reached each day in the "Food and Activity Tracker."   Set aside one 20 to 30-minute block of time every day or find two or more periods of 10 to15 minutes each for physical activity.   Use problem solving action plan created during session to problem solve.   Follow-Up Plan:  Attend Core Session 10 next week.   Email completed "Food and Activity Tracker" next week to be reviewed by Lifestyle Coach.  Email menus from favorite restaurants to next session for future discussion.

## 2020-09-01 ENCOUNTER — Encounter: Payer: Medicare Other | Admitting: Registered"

## 2020-09-01 DIAGNOSIS — R7303 Prediabetes: Secondary | ICD-10-CM

## 2020-09-15 ENCOUNTER — Encounter: Payer: Medicare Other | Attending: Family Medicine

## 2020-09-15 DIAGNOSIS — R7303 Prediabetes: Secondary | ICD-10-CM | POA: Insufficient documentation

## 2020-09-21 ENCOUNTER — Telehealth: Payer: Self-pay | Admitting: Family Medicine

## 2020-09-21 NOTE — Telephone Encounter (Signed)
Left message for patient to call back and schedule Medicare Annual Wellness Visit (AWV) either virtually or in office.   AWV-I per PAL[place] 07/04/16  please schedule at anytime with [facility] Nurse Health Advisor 1 or 2   This should be a 45 minute visit.

## 2020-09-22 ENCOUNTER — Encounter: Payer: Medicare Other | Admitting: Registered"

## 2020-09-29 ENCOUNTER — Encounter (HOSPITAL_BASED_OUTPATIENT_CLINIC_OR_DEPARTMENT_OTHER): Payer: Medicare Other | Admitting: Registered"

## 2020-09-29 DIAGNOSIS — R7303 Prediabetes: Secondary | ICD-10-CM

## 2020-09-29 NOTE — Progress Notes (Signed)
On 09/29/20 patient completed the Core Session 13 of Diabetes Prevention Program course virtually with [facility]. By the end of this session patients are able to complete the following objectives:   Virtual Visit via Video Note  I connected with Samantha Burch on 09/29/20 at  3:30 PM EDT by a video enabled application and verified that I am speaking with the correct person using two identifiers.  Location: Patient: Home.  Provider: Office.   Learning Objectives:  Describe ways to add interest and variety to their activity plans.  Define ?aerobic fitness.  Explain the four F.I.T.T. principles (frequency, intensity, time, and type of activity) and how they relate to aerobic fitness.   Goals:   Record weight taken outside of class.   Track foods and beverages eaten each day in the "Food and Activity Tracker," including calories and fat grams for each item.    Track activity type, minutes you were active, and distance you reached each day in the "Food and Activity Tracker."   Do your best to reach activity goal for the week.  Use one of the F.I.T.T. principles to jump start workouts.  Document activity level on the "To Do Next Week" handout.  Follow-Up Plan:  Attend Core Session 14 next week.   Email completed "Food and Activity Tracker" before next week to be reviewed by Lifestyle Coach.

## 2020-10-06 ENCOUNTER — Encounter: Payer: Medicare Other | Admitting: Registered"

## 2020-10-13 ENCOUNTER — Encounter: Payer: Medicare Other | Attending: Family Medicine | Admitting: Registered"

## 2020-10-13 DIAGNOSIS — R7303 Prediabetes: Secondary | ICD-10-CM | POA: Insufficient documentation

## 2020-10-20 ENCOUNTER — Encounter: Payer: Self-pay | Admitting: Registered"

## 2020-10-20 ENCOUNTER — Encounter: Payer: Medicare Other | Admitting: Registered"

## 2020-10-20 NOTE — Progress Notes (Signed)
On 10/13/20 patient completed Core Session 15 of Diabetes Prevention Program course virtually with [facility]. By the end of this session patients are able to complete the following objectives:   Virtual Visit via Video Note  I connected with Samantha Burch on 10/13/20 at  3:30 PM EDT by a video enabled application and verified that I am speaking with the correct person using two identifiers.  Location: Patient: Home.  Provider: Office.   Learning Objectives: Explain how to prevent stress or cope with unavoidable stress.  Describe how this program can be a source of stress.  Explain how to manage stressful situations.  Create and follow an action plan for either preventing or coping with a stressful situation.   Goals:  Record weight taken outside of class.  Track foods and beverages eaten each day in the "Food and Activity Tracker," including calories and fat grams for each item.   Track activity type, minutes you were active, and distance you reached each day in the "Food and Activity Tracker."  Do your best to reach activity goal for the week. Follow your action plan to reduce stress.  Answer questions on handout regarding success of action plan.   Follow-Up Plan: Attend Core Session 16 next week.  Email completed "Food and Activity Tracker" before next week to be reviewed by Lifestyle Coach.

## 2020-10-27 ENCOUNTER — Encounter: Payer: Self-pay | Admitting: Registered"

## 2020-10-27 ENCOUNTER — Encounter: Payer: Medicare Other | Admitting: Registered"

## 2020-10-27 DIAGNOSIS — R7303 Prediabetes: Secondary | ICD-10-CM

## 2020-10-27 NOTE — Progress Notes (Signed)
On 10/27/20 patient attended a virtual grocery store tour session as part of the Diabetes Prevention Program with [facility].  Virtual Visit via Video Note  I connected with [name] by a video enabled application and verified that I am speaking with the correct person using two identifiers.  Location: Patient: Home.  Provider: Office.    Learning Objectives: Develop a plan for our grocery shopping experience Putting together a list How to navigate the grocery store Identify 4 main sections of the grocery store Produce Meat/Poultry/Fish [place] Consider tips for shopping in each of these four sections Reflect on our own shopping habits Create a new goal for our next grocery shopping experience Engage in a group discussion  Goals:  Record weight taken outside of class.  Track foods and beverages eaten each day in the "Food and Activity Tracker," including calories and fat grams for each item.  Create one new goal for the next grocery shopping experience based on the information provided today  Follow-Up Plan: Attend next session.  Email completed "Food and Activity Tracker" before next session to be reviewed by Lifestyle Coach.

## 2020-11-08 ENCOUNTER — Other Ambulatory Visit: Payer: Self-pay

## 2020-11-09 ENCOUNTER — Other Ambulatory Visit (INDEPENDENT_AMBULATORY_CARE_PROVIDER_SITE_OTHER): Payer: Medicare Other

## 2020-11-09 DIAGNOSIS — R739 Hyperglycemia, unspecified: Secondary | ICD-10-CM | POA: Diagnosis not present

## 2020-11-09 DIAGNOSIS — E785 Hyperlipidemia, unspecified: Secondary | ICD-10-CM

## 2020-11-09 LAB — LIPID PANEL
Cholesterol: 258 mg/dL — ABNORMAL HIGH (ref 0–200)
HDL: 52.6 mg/dL (ref >39.00)
LDL Cholesterol: 167 mg/dL — ABNORMAL HIGH (ref 0–99)
NonHDL: 204.96
Total CHOL/HDL Ratio: 5
Triglycerides: 188 mg/dL — ABNORMAL HIGH (ref 0.0–149.0)
VLDL: 37.6 mg/dL (ref 0.0–40.0)

## 2020-11-09 LAB — HEMOGLOBIN A1C: Hgb A1c MFr Bld: 6.3 % (ref 4.6–6.5)

## 2020-11-10 ENCOUNTER — Encounter: Payer: Medicare Other | Admitting: Registered"

## 2020-11-15 ENCOUNTER — Encounter: Payer: Self-pay | Admitting: Family Medicine

## 2020-11-15 ENCOUNTER — Ambulatory Visit (INDEPENDENT_AMBULATORY_CARE_PROVIDER_SITE_OTHER): Payer: Medicare Other | Admitting: Family Medicine

## 2020-11-15 ENCOUNTER — Other Ambulatory Visit: Payer: Self-pay

## 2020-11-15 VITALS — BP 124/68 | HR 69 | Temp 97.6°F | Ht 61.0 in | Wt 196.3 lb

## 2020-11-15 DIAGNOSIS — R739 Hyperglycemia, unspecified: Secondary | ICD-10-CM | POA: Diagnosis not present

## 2020-11-15 DIAGNOSIS — E785 Hyperlipidemia, unspecified: Secondary | ICD-10-CM

## 2020-11-15 DIAGNOSIS — F419 Anxiety disorder, unspecified: Secondary | ICD-10-CM

## 2020-11-15 DIAGNOSIS — F339 Major depressive disorder, recurrent, unspecified: Secondary | ICD-10-CM

## 2020-11-15 DIAGNOSIS — E039 Hypothyroidism, unspecified: Secondary | ICD-10-CM

## 2020-11-15 NOTE — Progress Notes (Signed)
Samantha Burch DOB: Oct 10, 1955 Encounter date: 11/15/2020  This is a 65 y.o. female who presents with Chief Complaint  Patient presents with   Follow-up     History of present illness:  Still planning on getting shingles vaccine.   Hyperglycemia: She has been working with Firef[employer].  Last visit she was working on increasing activity level as well.  Hyperlipidemia: Crestor half tablet 3 times a week.  LDL is 167 on most recent labs; this is decreased from 217 3 months ago. She is tolerating this well; no muscle aches/cramps.  Anxiety was quite significant when we talked last.  However she felt like she was working on getting things somewhat better for her. Has had a harder time starting exercise routine.   Not sure if bp elevation today is related to anxiety. Doesn't salt anything and tries to stay away from salt. Does have cuff at home; but hasn't been using it regularly.   Allergies  Allergen Reactions   Elemental Sulfur    Lipitor [Atorvastatin]     Cramping muscles   Pravastatin     cramping   Sulfa Antibiotics    Current Meds  Medication Sig   busPIRone (BUSPAR) 15 MG tablet TAKE 1 TABLET BY MOUTH TWICE A DAY   febuxostat (ULORIC) 40 MG tablet Take 40 mg by mouth daily.   fexofenadine (ALLEGRA) 180 MG tablet TAKE 1 TABLET BY MOUTH EVERY DAY   levothyroxine (SYNTHROID) 100 MCG tablet TAKE 1 TABLET BY MOUTH EVERY DAY   omeprazole (PRILOSEC) 40 MG capsule TAKE 1 CAPSULE BY MOUTH EVERY DAY   pregabalin (LYRICA) 225 MG capsule TAKE 1 CAPSULE BY MOUTH 2 TIMES DAILY.   rosuvastatin (CRESTOR) 5 MG tablet Take 0.5 tablets (2.5 mg total) by mouth 3 (three) times a week.   sertraline (ZOLOFT) 100 MG tablet TAKE 1.5 TABLETS BY MOUTH DAILY   tolterodine (DETROL) 2 MG tablet Take 1 tablet (2 mg total) by mouth 2 (two) times daily.   Vitamin D, Ergocalciferol, (DRISDOL) 1.25 MG (50000 UNIT) CAPS capsule TAKE 1 CAPSULE (50,000 UNITS TOTAL) BY MOUTH EVERY 7 (SEVEN)  DAYS.    Review of Systems  Constitutional:  Negative for chills, fatigue and fever.  Respiratory:  Negative for cough, chest tightness, shortness of breath and wheezing.   Cardiovascular:  Negative for chest pain, palpitations and leg swelling (has been doing really well with this and compression stockings are helpful).   Objective:  BP 124/68   Pulse 69   Temp 97.6 F (36.4 C) (Oral)   Ht 5\' 1"  (1.549 m)   Wt 196 lb 4.8 oz (89 kg)   LMP 05/06/2006 (Approximate)   SpO2 96%   BMI 37.09 kg/m   Weight: 196 lb 4.8 oz (89 kg)   BP Readings from Last 3 Encounters:  11/15/20 124/68  01/14/20 (!) 157/72  09/22/19 132/69   Wt Readings from Last 3 Encounters:  11/15/20 196 lb 4.8 oz (89 kg)  08/04/20 197 lb 9.6 oz (89.6 kg)  01/14/20 202 lb 12.8 oz (92 kg)    Physical Exam Constitutional:      General: She is not in acute distress.    Appearance: She is well-developed.  Cardiovascular:     Rate and Rhythm: Normal rate and regular rhythm.     Heart sounds: Normal heart sounds. No murmur heard.   No friction rub.  Pulmonary:     Effort: Pulmonary effort is normal. No respiratory distress.     Breath sounds:  Normal breath sounds. No wheezing or rales.  Musculoskeletal:     Right lower leg: No edema.     Left lower leg: No edema.  Neurological:     Mental Status: She is alert and oriented to person, place, and time.  Psychiatric:        Behavior: Behavior normal.    Assessment/Plan  1. Acquired hypothyroidism Continue with synthroid 139mcg; has been well controlled. - CBC with Differential/Platelet; Future - TSH; Future  2. Depression, recurrent ([place]) Mood has been stable. She is doing well with this transition of son going to school, getting drivers license; although it is hard. Zoloft 100mg   3. Anxiety Continue with zoloft 100mg . Continue with buspar 15mg  BID.   4. Hyperglycemia She is working hard on limiting carbohydrates; she is planning to start with regular  activity; she just has to do things at her own pace.  - Hemoglobin A1c; Future  5. Hyperlipidemia, unspecified hyperlipidemia type She has tolerated the 1/2 tab of crestor 3days per week, but after reviewing blood work we are going to have her try to take 1/2 tablet daily. She will let me know how she does with this.  - Comprehensive metabolic panel; Future - Lipid panel; Future    Return in about 3 months (around 02/15/2021) for Chronic condition visit; blood work prior.     Micheline Rough, MD

## 2020-11-16 MED ORDER — ROSUVASTATIN CALCIUM 5 MG PO TABS: 2.5000 mg | ORAL_TABLET | Freq: Every day | ORAL | 1 refills | Status: DC

## 2020-11-22 ENCOUNTER — Telehealth: Payer: Self-pay | Admitting: Family Medicine

## 2020-11-22 NOTE — Telephone Encounter (Signed)
Left message for patient to call back and schedule Medicare Annual Wellness Visit (AWV) either virtually or in office.   AWV-I per [place] 07/04/16 please schedule at anytime with [facility] Nurse Health Advisor 1 or 2   This should be a 45 minute visit.

## 2020-11-24 ENCOUNTER — Encounter: Payer: Self-pay | Admitting: Registered"

## 2020-11-24 ENCOUNTER — Encounter: Payer: Medicare Other | Attending: Family Medicine | Admitting: Registered"

## 2020-11-24 DIAGNOSIS — R7303 Prediabetes: Secondary | ICD-10-CM | POA: Diagnosis not present

## 2020-11-24 NOTE — Progress Notes (Signed)
On 11/24/20 patient completed a post core session of the Diabetes Prevention Program course virtually with [facility]. By the end of this session patients are able to complete the following objectives:   Virtual Visit via Video Note  I connected with [name] by a video enabled application and verified that I am speaking with the correct person using two identifiers.  Location: Patient: Home.  Provider: Office.   Learning Objectives: Describe the importance of having regular meals each day and how skipping meals can negatively affect food choices and weight.  Plan out balanced meals and snacks. List ways to avoid unplanned snacking.   Goals:  Record weight taken outside of class.  Track foods and beverages eaten each day in the "Food and Activity Tracker," including calories and fat grams for each item.   Track activity type, minutes you were active, and distance you reached each day in the "Food and Activity Tracker."   Follow-Up Plan: Attend next session.  Email completed "Food and Activity Trackers" before next session to be reviewed by Lifestyle Coach.

## 2020-12-08 ENCOUNTER — Ambulatory Visit (HOSPITAL_BASED_OUTPATIENT_CLINIC_OR_DEPARTMENT_OTHER): Payer: Medicare Other | Admitting: Registered"

## 2020-12-08 DIAGNOSIS — R7303 Prediabetes: Secondary | ICD-10-CM

## 2020-12-14 ENCOUNTER — Encounter: Payer: Self-pay | Admitting: Registered"

## 2020-12-14 NOTE — Progress Notes (Signed)
On 12/08/20 patient completed a post core session of the Diabetes Prevention Program course virtually with [facility]. By the end of this session patients are able to complete the following objectives:   Virtual Visit via Video Note  I connected with Samantha Burch by a video enabled application and verified that I am speaking with the correct person using two identifiers.  Location: Patient: Home Provider: Office.   Learning Objectives: Describe the importance of having regular meals each day and how skipping meals can negatively affect food choices and weight.  Plan out balanced meals and snacks. List ways to avoid unplanned snacking.   Goals:  Record weight taken outside of class.  Track foods and beverages eaten each day in the "Food and Activity Tracker," including calories and fat grams for each item.   Track activity type, minutes you were active, and distance you reached each day in the "Food and Activity Tracker."   Follow-Up Plan: Attend next session.  Email completed "Food and Activity Trackers" before next session to be reviewed by Lifestyle Coach.

## 2020-12-21 ENCOUNTER — Telehealth: Payer: Self-pay | Admitting: Family Medicine

## 2020-12-21 NOTE — Telephone Encounter (Signed)
Left message for patient to call back and schedule Medicare Annual Wellness Visit (AWV) either virtually or in office.  Left my jabber # [phone]  AWV-I per [place] 07/04/16 please schedule at anytime with [facility] Nurse Health Advisor 1 or 2   This should be a 45 minute visit.

## 2020-12-29 ENCOUNTER — Ambulatory Visit (HOSPITAL_BASED_OUTPATIENT_CLINIC_OR_DEPARTMENT_OTHER): Payer: Medicare Other | Admitting: Registered"

## 2020-12-29 DIAGNOSIS — R7303 Prediabetes: Secondary | ICD-10-CM

## 2021-01-02 NOTE — Progress Notes (Signed)
On 12/29/20 patient completed a post core session of the Diabetes Prevention Program course virtually with [facility]. By the end of this session patients are able to complete the following objectives:   Virtual Visit via Video Note  I connected with Samantha Burch by a video enabled application and verified that I am speaking with the correct person using two identifiers.  Location: Patient: Home.  Provider: Office.   Learning Objectives: Identify which foods contain carbohydrates.  List functions for carbohydrates on the body.  Describe the relationship between carbohydrate intake and blood sugar.  Create balanced snack choices.   Goals:  Record weight taken outside of class.  Track foods and beverages eaten each day in the "Food and Activity Tracker," including calories and fat grams for each item.   Track activity type, minutes you were active, and distance you reached each day in the "Food and Activity Tracker."   Follow-Up Plan: Attend next session.  Email completed "Food and Activity Trackers" before next session to be reviewed by Lifestyle Coach.

## 2021-01-03 ENCOUNTER — Encounter: Payer: Self-pay | Admitting: Registered"

## 2021-01-06 ENCOUNTER — Other Ambulatory Visit: Payer: Self-pay | Admitting: Family Medicine

## 2021-01-06 DIAGNOSIS — F419 Anxiety disorder, unspecified: Secondary | ICD-10-CM

## 2021-01-06 DIAGNOSIS — F32A Depression, unspecified: Secondary | ICD-10-CM

## 2021-01-06 DIAGNOSIS — E039 Hypothyroidism, unspecified: Secondary | ICD-10-CM

## 2021-01-09 ENCOUNTER — Encounter: Payer: Self-pay | Admitting: Registered"

## 2021-01-09 NOTE — Progress Notes (Signed)
On 09/01/20 patient completed Core Session 10 of Diabetes Prevention Program course virtually with [facility]. The following learning objectives were met by the patient during this class:   Virtual Visit via Video Note  I connected with [name] by a video enabled application and verified that I am speaking with the correct person using two identifiers.  Location: Patient: Home.  Provider: Office.   Learning Objectives: List and describe the four keys for healthy eating out.  Give examples of how to apply these keys at the type of restaurants that the participants go to regularly.  Make an appropriate meal selection from a restaurant menu.  Demonstrate how to ask for a substitute item using assertive language and a polite tone of voice.    Goals:  Record weight taken outside of class.  Track foods and beverages eaten each day in the "Food and Activity Tracker," including calories and fat grams for each item.   Track activity type, minutes you were active, and distance you reached each day in the "Food and Activity Tracker."  Set aside one 20 to 30-minute block of time every day or find two or more periods of 10 to15 minutes each for physical activity.  Utilize positive action plan and complete questions on "To Do List."   Follow-Up Plan: Attend Core Session 11.  Email completed "Food and Activity Tracker" to be reviewed by Lifestyle Coach.

## 2021-01-11 DIAGNOSIS — R7989 Other specified abnormal findings of blood chemistry: Secondary | ICD-10-CM | POA: Diagnosis not present

## 2021-01-11 DIAGNOSIS — E669 Obesity, unspecified: Secondary | ICD-10-CM | POA: Diagnosis not present

## 2021-01-11 DIAGNOSIS — M1A09X0 Idiopathic chronic gout, multiple sites, without tophus (tophi): Secondary | ICD-10-CM | POA: Diagnosis not present

## 2021-01-11 DIAGNOSIS — Z6836 Body mass index (BMI) 36.0-36.9, adult: Secondary | ICD-10-CM | POA: Diagnosis not present

## 2021-01-11 DIAGNOSIS — M2550 Pain in unspecified joint: Secondary | ICD-10-CM | POA: Diagnosis not present

## 2021-01-12 ENCOUNTER — Emergency Department (HOSPITAL_BASED_OUTPATIENT_CLINIC_OR_DEPARTMENT_OTHER)
Admission: EM | Admit: 2021-01-12 | Discharge: 2021-01-12 | Disposition: A | Discharge status: Home / Self Care | Payer: Medicare Other | Attending: Emergency Medicine | Admitting: Emergency Medicine

## 2021-01-12 ENCOUNTER — Encounter: Payer: Medicare Other | Admitting: Registered"

## 2021-01-12 ENCOUNTER — Encounter (HOSPITAL_BASED_OUTPATIENT_CLINIC_OR_DEPARTMENT_OTHER): Payer: Self-pay | Admitting: *Deleted

## 2021-01-12 ENCOUNTER — Other Ambulatory Visit: Payer: Self-pay

## 2021-01-12 ENCOUNTER — Emergency Department (HOSPITAL_BASED_OUTPATIENT_CLINIC_OR_DEPARTMENT_OTHER): Payer: Medicare Other

## 2021-01-12 DIAGNOSIS — M546 Pain in thoracic spine: Secondary | ICD-10-CM | POA: Diagnosis not present

## 2021-01-12 DIAGNOSIS — R109 Unspecified abdominal pain: Secondary | ICD-10-CM | POA: Insufficient documentation

## 2021-01-12 DIAGNOSIS — Z87891 Personal history of nicotine dependence: Secondary | ICD-10-CM | POA: Insufficient documentation

## 2021-01-12 DIAGNOSIS — Z79899 Other long term (current) drug therapy: Secondary | ICD-10-CM | POA: Diagnosis not present

## 2021-01-12 DIAGNOSIS — E039 Hypothyroidism, unspecified: Secondary | ICD-10-CM | POA: Insufficient documentation

## 2021-01-12 LAB — BASIC METABOLIC PANEL
Anion gap: 10 (ref 5–15)
BUN: 12 mg/dL (ref 8–23)
CO2: 27 mmol/L (ref 22–32)
Calcium: 9.6 mg/dL (ref 8.9–10.3)
Chloride: 103 mmol/L (ref 98–111)
Creatinine, Ser: 0.75 mg/dL (ref 0.44–1.00)
GFR, Estimated: >60 mL/min (ref >60)
Glucose, Bld: 105 mg/dL — ABNORMAL HIGH (ref 70–99)
Potassium: 3.9 mmol/L (ref 3.5–5.1)
Sodium: 140 mmol/L (ref 135–145)

## 2021-01-12 LAB — URINALYSIS, ROUTINE W REFLEX MICROSCOPIC
Bilirubin Urine: NEGATIVE
Glucose, UA: NEGATIVE mg/dL
Hgb urine dipstick: NEGATIVE
Ketones, ur: NEGATIVE mg/dL
Nitrite: NEGATIVE
Protein, ur: NEGATIVE mg/dL
Specific Gravity, Urine: 1.019 (ref 1.005–1.030)
pH: 7 (ref 5.0–8.0)

## 2021-01-12 LAB — CBC
HCT: 40.4 % (ref 36.0–46.0)
Hemoglobin: 13.8 g/dL (ref 12.0–15.0)
MCH: 29 pg (ref 26.0–34.0)
MCHC: 34.2 g/dL (ref 30.0–36.0)
MCV: 84.9 fL (ref 80.0–100.0)
Platelets: 192 10*3/uL (ref 150–400)
RBC: 4.76 MIL/uL (ref 3.87–5.11)
RDW: 13 % (ref 11.5–15.5)
WBC: 5.7 10*3/uL (ref 4.0–10.5)
nRBC: 0 % (ref 0.0–0.2)

## 2021-01-12 NOTE — ED Triage Notes (Signed)
Bilateral flank pain for 2 weeks.

## 2021-01-12 NOTE — ED Provider Notes (Signed)
[facility] DEPT Provider Note   CSN: [id number] Arrival date & time: 01/12/21  1713     History Chief Complaint  Patient presents with   Flank Pain    Samantha Burch is a 65 y.o. female.  HPI 65 year old female presents with bilateral back pain.  This has been ongoing for a couple weeks.  It feels like it is in her bilateral mid back.  No trauma.  Sometimes leaning a certain way will make it hurt but otherwise no real inciting or relieving factors.  She is taken Advil without relief.  No fevers or dysuria.  She has noticed that her urine has been cloudy and she is urinating more often for the past couple weeks.  Each time she urinates there is a large amount of urine.  No weakness or numbness in her extremities or radiation of the pain.  No chest pain, shortness of breath, abdominal pain.  Past Medical History:  Diagnosis Date   Anxiety    Depression    GERD (gastroesophageal reflux disease)    Gout    Hyperlipidemia    Hypoglycemia    Hypothyroidism    Neurogenic bladder    Neuropathy    Urinary incontinence     Patient Active Problem List   Diagnosis Date Noted   Vitamin D deficiency 04/26/2020   Hyperglycemia 04/26/2020   Hyperlipidemia 04/26/2020   B12 deficiency 01/05/2020   Overactive bladder 01/05/2020   Fatty liver 01/05/2020   Gout    Hypothyroidism    GERD (gastroesophageal reflux disease)    Idiopathic peripheral neuropathy 09/19/2016   Depression, recurrent ([facility]) 09/19/2016   Anxiety 09/19/2016   Neurogenic bladder 09/19/2016    Past Surgical History:  Procedure Laterality Date   MULTIPLE TOOTH EXTRACTIONS  2003-2004   for dentures     OB History     Gravida  1   Para  1   Term      Preterm      AB      Living         SAB      IAB      Ectopic      Multiple      Live Births              Family History  Problem Relation Age of Onset   Ovarian cancer Mother    Alcohol abuse Father     Hypertension Father    Diabetes type II Father    Neuropathy Father    Neuropathy Paternal Grandmother    Colon cancer Maternal Uncle     Social History   Tobacco Use   Smoking status: Former    Packs/day: 1.00    Years: 30.00    Pack years: 30.00    Types: Cigarettes    Quit date: 05/07/2011    Years since quitting: 9.6   Smokeless tobacco: Never  Vaping Use   Vaping Use: Never used  Substance Use Topics   Alcohol use: No   Drug use: No    Home Medications Prior to Admission medications   Medication Sig Start Date End Date Taking? Authorizing Provider  busPIRone (BUSPAR) 15 MG tablet TAKE 1 TABLET BY MOUTH TWICE A DAY 01/07/21  Yes Koberlein, Junell C, MD  febuxostat (ULORIC) 40 MG tablet Take 40 mg by mouth daily.   Yes [provider]  fexofenadine (ALLEGRA) 180 MG tablet TAKE 1 TABLET BY MOUTH EVERY DAY 07/14/20  Yes Koberlein, Junell  C, MD  levothyroxine (SYNTHROID) 100 MCG tablet TAKE 1 TABLET BY MOUTH EVERY DAY 01/07/21  Yes Koberlein, Junell C, MD  omeprazole (PRILOSEC) 40 MG capsule TAKE 1 CAPSULE BY MOUTH EVERY DAY 01/07/21  Yes Koberlein, Junell C, MD  pregabalin (LYRICA) 225 MG capsule TAKE 1 CAPSULE BY MOUTH 2 TIMES DAILY. 07/14/20  Yes Cameron Sprang, MD  rosuvastatin (CRESTOR) 5 MG tablet Take 0.5 tablets (2.5 mg total) by mouth daily. 11/16/20  Yes Koberlein, Junell C, MD  sertraline (ZOLOFT) 100 MG tablet TAKE 1 AND 1/2 TABLETS BY MOUTH EVERY DAY 01/07/21  Yes Koberlein, Junell C, MD  tolterodine (DETROL) 2 MG tablet Take 1 tablet (2 mg total) by mouth 2 (two) times daily. 01/18/20  Yes Koberlein, Steele Berg, MD  Vitamin D, Ergocalciferol, (DRISDOL) 1.25 MG (50000 UNIT) CAPS capsule TAKE 1 CAPSULE (50,000 UNITS TOTAL) BY MOUTH EVERY 7 (SEVEN) DAYS 01/07/21  Yes Koberlein, Junell C, MD    Allergies    Elemental sulfur, Lipitor [atorvastatin], Pravastatin, and Sulfa antibiotics  Review of Systems   Review of Systems  Constitutional:  Negative for fever.   Respiratory:  Negative for shortness of breath.   Cardiovascular:  Negative for chest pain.  Gastrointestinal:  Negative for abdominal pain.  Genitourinary:  Negative for dysuria.  Musculoskeletal:  Positive for back pain.  Neurological:  Negative for weakness and numbness.  All other systems reviewed and are negative.  Physical Exam Updated Vital Signs BP (!) 148/80 (BP Location: Right Arm)   Pulse 65   Temp 97.9 F (36.6 C)   Resp 16   Ht 5' 1.5" (1.562 m)   Wt 88 kg   LMP 05/06/2006 (Approximate)   SpO2 97%   BMI 36.06 kg/m   Physical Exam Vitals and nursing note reviewed.  Constitutional:      Appearance: She is well-developed.  HENT:     Head: Normocephalic and atraumatic.     Right Ear: External ear normal.     Left Ear: External ear normal.     Nose: Nose normal.  Eyes:     General:        Right eye: No discharge.        Left eye: No discharge.  Cardiovascular:     Rate and Rhythm: Normal rate and regular rhythm.     Heart sounds: Normal heart sounds.  Pulmonary:     Effort: Pulmonary effort is normal.     Breath sounds: Normal breath sounds.  Abdominal:     Palpations: Abdomen is soft.     Tenderness: There is no abdominal tenderness.  Musculoskeletal:     Thoracic back: Tenderness present.       Back:  Skin:    General: Skin is warm and dry.  Neurological:     Mental Status: She is alert.  Psychiatric:        Mood and Affect: Mood is not anxious.    ED Results / Procedures / Treatments   Labs (all labs ordered are listed, but only abnormal results are displayed) Labs Reviewed  BASIC METABOLIC PANEL - Abnormal; Notable for the following components:      Result Value   Glucose, Bld 105 (*)    All other components within normal limits  URINALYSIS, ROUTINE W REFLEX MICROSCOPIC - Abnormal; Notable for the following components:   Leukocytes,Ua SMALL (*)    All other components within normal limits  CBC    EKG None  Radiology CT Renal  Stone Study  Result Date: 01/12/2021 CLINICAL DATA:  Bilateral flank pain for 2 weeks. EXAM: CT ABDOMEN AND PELVIS WITHOUT CONTRAST TECHNIQUE: Multidetector CT imaging of the abdomen and pelvis was performed following the standard protocol without IV contrast. COMPARISON:  None. FINDINGS: Lower chest: No acute abnormality. Hepatobiliary: No focal liver abnormality is seen. No gallstones, gallbladder wall thickening, or biliary dilatation. Pancreas: Unremarkable. No pancreatic ductal dilatation or surrounding inflammatory changes. Spleen: Normal in size without focal abnormality. Adrenals/Urinary Tract: Adrenal glands are unremarkable. Kidneys are normal, without renal calculi, focal lesion, or hydronephrosis. Bladder is unremarkable. Stomach/Bowel: Stomach is within normal limits. Appendix appears normal. No evidence of bowel wall thickening, distention, or inflammatory changes. Vascular/Lymphatic: Aortic atherosclerosis. No enlarged abdominal or pelvic lymph nodes. Reproductive: Uterus and bilateral adnexa are unremarkable. Other: No abdominal wall hernia or abnormality. No abdominopelvic ascites. Musculoskeletal: Degenerative changes affect the spine. IMPRESSION: 1. No evidence for urinary tract calculus or hydronephrosis. Electronically Signed   By: Ronney Asters M.D.   On: 01/12/2021 20:38    Procedures Procedures   Medications Ordered in ED Medications - No data to display  ED Course  I have reviewed the triage vital signs and the nursing notes.  Pertinent labs & imaging results that were available during my care of the patient were reviewed by me and considered in my medical decision making (see chart for details).    MDM Rules/Calculators/A&P                           Patient's exam shows some mid thoracic tenderness near her CVA but everything else including the CT stone study ordered in triage, labs and urinalysis are all unimpressive.  Normal WBC and renal function.  Urinalysis is not  really consistent with UTI and I have a pretty low suspicion that this is pyelonephritis.  After 2 weeks of symptoms I would expect her to be more ill.  However I will send the urine for culture but at this point I do not think antibiotics are warranted.  I offered treatments but she declines and I think she will need to follow-up with PCP if not improving.  No signs/symptoms this is a spinal cord emergency.  Discharged home with return precautions. Final Clinical Impression(s) / ED Diagnoses Final diagnoses:  Acute bilateral thoracic back pain    Rx / DC Orders ED Discharge Orders     None        Sherwood Gambler, MD 01/12/21 2211

## 2021-01-12 NOTE — Discharge Instructions (Signed)
If you develop worsening, recurrent, or continued back pain, numbness or weakness in the legs, incontinence of your bowels or bladders, numbness of your buttocks, fever, abdominal pain, or any other new/concerning symptoms then return to the ER for evaluation.  

## 2021-01-12 NOTE — ED Notes (Signed)
This RN presented the AVS utilizing Teachback Method. Patient verbalizes understanding of Discharge Instructions. Opportunity for Questioning and Answers were provided. Patient Discharged from ED ambulatory to Home via SELF.

## 2021-01-14 LAB — URINE CULTURE

## 2021-01-15 ENCOUNTER — Other Ambulatory Visit: Payer: Self-pay

## 2021-01-15 ENCOUNTER — Encounter: Payer: Self-pay | Admitting: Neurology

## 2021-01-15 ENCOUNTER — Ambulatory Visit (INDEPENDENT_AMBULATORY_CARE_PROVIDER_SITE_OTHER): Payer: Medicare Other | Admitting: Neurology

## 2021-01-15 DIAGNOSIS — G609 Hereditary and idiopathic neuropathy, unspecified: Secondary | ICD-10-CM | POA: Diagnosis not present

## 2021-01-15 MED ORDER — PREGABALIN 225 MG PO CAPS: 225.0000 mg | ORAL_CAPSULE | Freq: Two times a day (BID) | ORAL | 3 refills | Status: DC

## 2021-01-15 NOTE — Patient Instructions (Signed)
Always good to see you! Continue Lyrica '225mg'$  twice a day. Continue control of sugar levels. Follow-up in 1 year, call for any changes.

## 2021-01-15 NOTE — Progress Notes (Signed)
NEUROLOGY FOLLOW UP OFFICE NOTE  Samantha Burch  HISTORY OF PRESENT ILLNESS: I had the pleasure of seeing Samantha Burch in follow-up in the neurology clinic on 01/15/2021.  The patient was last seen a year ago for peripheral polyneuropathy. She is alone in the office today. She has been on Lyrica '225mg'$  BID for neuropathic pain without side effects. She states symptoms are about the same with tingling, numbness, burning that have good response to Lyrica. Symptoms are not keeping her up at night. She has been told she is prediabetic and has started educating herself about it. She denies any headaches, dizziness, vision changes, bowel/bladder dysfunction. No falls. Sleep overall okay. Her back is still bothering her, no neck pain.    History on Initial Assessment 09/19/2016: This is a very pleasant 65 year old right-handed woman with a history of hypothyroidism, neurogenic bladder, depression, anxiety, presenting to establish care for peripheral polyneuropathy. She recently moved to [place] from [place] with her son. She had been seeing neurologist Dr. [name] for the neuropathy. 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 Symptoms worsened over time with significant pain in both legs described as pain inside her legs like she wanted to scratch them all the time. She described pins and needles sensation, sometimes affecting her arms as well. She was initially started on gabapentin but it stopped working, and has been taking Lyrica for the past 10 years. She is taking Lyrica '225mg'$  BID without side effects. She reports symptom control is much better but not perfect, there are days the neuropathy bothers her, more when she is inactive. She has found that moving around helps relieve the symptoms. She fell last year and broke her left foot, and since then has tended to veer when she gets up. She denies any  back pain, but reports pain in the right hip region radiating down the back of her right leg. She tells me her neurologist told her she has unusually brisk reflexes for the neuropathy, per records she has had an MRI brain, cervical, thoracic spine that were unremarkable. NCS reported as normal. Her B12 level was low and she has been on monthly B12 injections. She has a history of vertigo that significantly improved with vestibular therapy, she was told she has gaze instability, and has intermittent vertigo with positional changes. She denies any headaches, tinnitus, diplopia, dysarthria/dysphagia, neck pain, bowel/bladder dysfunction. She has a history of neurogenic bladder on Detrol, low potassium on daily potassium supplements, and depression/anxiety on Zoloft. She reports a family history of peripheral polyneuropathy in her father and paternal grandfather.   PAST MEDICAL HISTORY: Past Medical History:  Diagnosis Date   Anxiety    Depression    GERD (gastroesophageal reflux disease)    Gout    Hyperlipidemia    Hypoglycemia    Hypothyroidism    Neurogenic bladder    Neuropathy    Urinary incontinence     MEDICATIONS: Current Outpatient Medications on File Prior to Visit  Medication Sig Dispense Refill   busPIRone (BUSPAR) 15 MG tablet TAKE 1 TABLET BY MOUTH TWICE A DAY 180 tablet 1   Cobalamin Combinations (B-12) 1000-400 MCG SUBL Place under the tongue daily.     febuxostat (ULORIC) 40 MG tablet Take 40 mg by mouth daily.     fexofenadine (ALLEGRA) 180 MG tablet TAKE 1 TABLET BY MOUTH EVERY DAY 90 tablet 1  levothyroxine (SYNTHROID) 100 MCG tablet TAKE 1 TABLET BY MOUTH EVERY DAY 90 tablet 1   omeprazole (PRILOSEC) 40 MG capsule TAKE 1 CAPSULE BY MOUTH EVERY DAY 90 capsule 1   pregabalin (LYRICA) 225 MG capsule TAKE 1 CAPSULE BY MOUTH 2 TIMES DAILY. 180 capsule 3   rosuvastatin (CRESTOR) 5 MG tablet Take 0.5 tablets (2.5 mg total) by mouth daily. 90 tablet 1   sertraline (ZOLOFT) 100  MG tablet TAKE 1 AND 1/2 TABLETS BY MOUTH EVERY DAY 135 tablet 1   tolterodine (DETROL) 2 MG tablet Take 1 tablet (2 mg total) by mouth 2 (two) times daily. 180 tablet 1   Vitamin D, Ergocalciferol, (DRISDOL) 1.25 MG (50000 UNIT) CAPS capsule TAKE 1 CAPSULE (50,000 UNITS TOTAL) BY MOUTH EVERY 7 (SEVEN) DAYS 12 capsule 1   No current facility-administered medications on file prior to visit.    ALLERGIES: Allergies  Allergen Reactions   Elemental Sulfur    Lipitor [Atorvastatin]     Cramping muscles   Pravastatin     cramping   Sulfa Antibiotics     FAMILY HISTORY: Family History  Problem Relation Age of Onset   Ovarian cancer Mother    Alcohol abuse Father    Hypertension Father    Diabetes type II Father    Neuropathy Father    Neuropathy Paternal Grandmother    Colon cancer Maternal Uncle     SOCIAL HISTORY: Social History   Socioeconomic History   Marital status: Divorced    Spouse name: Not on file   Number of children: Not on file   Years of education: Not on file   Highest education level: Not on file  Occupational History   Occupation: Disabled  Tobacco Use   Smoking status: Former    Packs/day: 1.00    Years: 30.00    Pack years: 30.00    Types: Cigarettes    Quit date: 05/07/2011    Years since quitting: 9.7   Smokeless tobacco: Never  Vaping Use   Vaping Use: Never used  Substance and Sexual Activity   Alcohol use: No   Drug use: No   Sexual activity: Never  Other Topics Concern   Not on file  Social History Narrative   Right handed      Highest level of edu- 12th grade      Son lives with pt ( age 2)      Social Determinants of Health   Financial Resource Strain: Not on file  Food Insecurity: Not on file  Transportation Needs: Not on file  Physical Activity: Not on file  Stress: Not on file  Social Connections: Not on file  Intimate Partner Violence: Not on file     PHYSICAL EXAM: Vitals:   01/15/21 1441  BP: (!) 131/57   Pulse: 74  SpO2: 98%   General: No acute distress Head:  Normocephalic/atraumatic Skin/Extremities: No rash, no edema Neurological Exam: alert and awake. No aphasia or dysarthria. Fund of knowledge is appropriate.  Recent and remote memory are intact.  Attention and concentration are normal.   Cranial nerves: Pupils equal, round. Extraocular movements intact with no nystagmus. Visual fields full.  No facial asymmetry.  Motor: Bulk and tone normal, muscle strength 5/5 throughout with no pronator drift. Sensation intact to cold, pin, vibration sent. Reflexes brisk +3 throughout with +Hoffman sign bilaterally, no clonus. Toes down. Finger to nose testing intact.  Gait narrow-based and steady, no ataxia.  Romberg negative.   IMPRESSION: This is  a very pleasant 65 yo RH woman with a history of hypothyroidism, neurogenic bladder, depression, anxiety, with peripheral polyneuropathy. Neuropathy has been stable over the years with good response to Lyrica '225mg'$  BID. Continue glucose control. She denies any focal weakness, reflexes are brisk. No neck pain. She has back pain but declines physical therapy for now. Follow-up in 1 year, she knows to call for any changes.   Thank you for allowing me to participate in her care.  Please do not hesitate to call for any questions or concerns.    Ellouise Newer, M.D.   CC: Dr. Ethlyn Gallery

## 2021-02-02 DIAGNOSIS — Z23 Encounter for immunization: Secondary | ICD-10-CM | POA: Diagnosis not present

## 2021-02-08 ENCOUNTER — Other Ambulatory Visit (INDEPENDENT_AMBULATORY_CARE_PROVIDER_SITE_OTHER): Payer: Medicare Other

## 2021-02-08 ENCOUNTER — Other Ambulatory Visit: Payer: Self-pay

## 2021-02-08 DIAGNOSIS — E039 Hypothyroidism, unspecified: Secondary | ICD-10-CM | POA: Diagnosis not present

## 2021-02-08 DIAGNOSIS — R739 Hyperglycemia, unspecified: Secondary | ICD-10-CM

## 2021-02-08 DIAGNOSIS — E785 Hyperlipidemia, unspecified: Secondary | ICD-10-CM

## 2021-02-08 LAB — COMPREHENSIVE METABOLIC PANEL
ALT: 15 U/L (ref 0–35)
AST: 21 U/L (ref 0–37)
Albumin: 4.1 g/dL (ref 3.5–5.2)
Alkaline Phosphatase: 75 U/L (ref 39–117)
BUN: 13 mg/dL (ref 6–23)
CO2: 31 mEq/L (ref 19–32)
Calcium: 9.1 mg/dL (ref 8.4–10.5)
Chloride: 103 mEq/L (ref 96–112)
Creatinine, Ser: 0.89 mg/dL (ref 0.40–1.20)
GFR: 68.4 mL/min (ref >60.00)
Glucose, Bld: 98 mg/dL (ref 70–99)
Potassium: 3.7 mEq/L (ref 3.5–5.1)
Sodium: 141 mEq/L (ref 135–145)
Total Bilirubin: 0.5 mg/dL (ref 0.2–1.2)
Total Protein: 6.8 g/dL (ref 6.0–8.3)

## 2021-02-08 LAB — LIPID PANEL
Cholesterol: 206 mg/dL — ABNORMAL HIGH (ref 0–200)
HDL: 50 mg/dL (ref >39.00)
LDL Cholesterol: 119 mg/dL — ABNORMAL HIGH (ref 0–99)
NonHDL: 156.04
Total CHOL/HDL Ratio: 4
Triglycerides: 186 mg/dL — ABNORMAL HIGH (ref 0.0–149.0)
VLDL: 37.2 mg/dL (ref 0.0–40.0)

## 2021-02-08 LAB — CBC WITH DIFFERENTIAL/PLATELET
Basophils Absolute: 0.1 10*3/uL (ref 0.0–0.1)
Basophils Relative: 0.8 % (ref 0.0–3.0)
Eosinophils Absolute: 0.2 10*3/uL (ref 0.0–0.7)
Eosinophils Relative: 2.9 % (ref 0.0–5.0)
HCT: 40 % (ref 36.0–46.0)
Hemoglobin: 13.7 g/dL (ref 12.0–15.0)
Lymphocytes Relative: 44.8 % (ref 12.0–46.0)
Lymphs Abs: 3 10*3/uL (ref 0.7–4.0)
MCHC: 34.1 g/dL (ref 30.0–36.0)
MCV: 86.3 fl (ref 78.0–100.0)
Monocytes Absolute: 0.6 10*3/uL (ref 0.1–1.0)
Monocytes Relative: 9.6 % (ref 3.0–12.0)
Neutro Abs: 2.8 10*3/uL (ref 1.4–7.7)
Neutrophils Relative %: 41.9 % — ABNORMAL LOW (ref 43.0–77.0)
Platelets: 188 10*3/uL (ref 150.0–400.0)
RBC: 4.63 Mil/uL (ref 3.87–5.11)
RDW: 13.4 % (ref 11.5–15.5)
WBC: 6.7 10*3/uL (ref 4.0–10.5)

## 2021-02-08 LAB — TSH: TSH: 0.69 u[IU]/mL (ref 0.35–5.50)

## 2021-02-08 LAB — HEMOGLOBIN A1C: Hgb A1c MFr Bld: 6.2 % (ref 4.6–6.5)

## 2021-02-15 ENCOUNTER — Other Ambulatory Visit: Payer: Self-pay

## 2021-02-16 ENCOUNTER — Telehealth: Payer: Self-pay | Admitting: *Deleted

## 2021-02-16 ENCOUNTER — Telehealth (INDEPENDENT_AMBULATORY_CARE_PROVIDER_SITE_OTHER): Payer: Medicare Other | Admitting: Family Medicine

## 2021-02-16 ENCOUNTER — Encounter: Payer: Medicare Other | Attending: Family Medicine | Admitting: Registered"

## 2021-02-16 ENCOUNTER — Encounter: Payer: Self-pay | Admitting: Family Medicine

## 2021-02-16 DIAGNOSIS — E559 Vitamin D deficiency, unspecified: Secondary | ICD-10-CM

## 2021-02-16 DIAGNOSIS — F419 Anxiety disorder, unspecified: Secondary | ICD-10-CM | POA: Diagnosis not present

## 2021-02-16 DIAGNOSIS — E039 Hypothyroidism, unspecified: Secondary | ICD-10-CM | POA: Diagnosis not present

## 2021-02-16 DIAGNOSIS — E785 Hyperlipidemia, unspecified: Secondary | ICD-10-CM | POA: Diagnosis not present

## 2021-02-16 DIAGNOSIS — R350 Frequency of micturition: Secondary | ICD-10-CM

## 2021-02-16 DIAGNOSIS — K219 Gastro-esophageal reflux disease without esophagitis: Secondary | ICD-10-CM

## 2021-02-16 DIAGNOSIS — R739 Hyperglycemia, unspecified: Secondary | ICD-10-CM | POA: Diagnosis not present

## 2021-02-16 DIAGNOSIS — R7303 Prediabetes: Secondary | ICD-10-CM | POA: Diagnosis not present

## 2021-02-16 DIAGNOSIS — F339 Major depressive disorder, recurrent, unspecified: Secondary | ICD-10-CM

## 2021-02-16 MED ORDER — MIRABEGRON ER 25 MG PO TB24: 25.0000 mg | ORAL_TABLET | Freq: Every day | ORAL | 0 refills | Status: DC

## 2021-02-16 NOTE — Progress Notes (Signed)
Virtual Visit via Video Note  I connected with Samantha Burch on 02/16/21 at  1:00 PM EDT by a video enabled telemedicine application and verified that I am speaking with the correct person using two identifiers.  Location patient: home Location provider: [place], [place] [address] Persons participating in the virtual visit: patient, provider  I discussed the limitations of evaluation and management by telemedicine and the availability of in person appointments. The patient expressed understanding and agreed to proceed.   Samantha Burch DOB: 03-26-1956 Encounter date: 02/16/2021  This is a 65 y.o. 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     Hyperlipidemia: Crestor 2.5mg  daily. She is tolerating this well; no muscle aches/cramps.    Son has [place] now; [place] friends. Tested positive this morning. She did complete booster 2 weeks ago.   Not doing all that well or all that bad. Son got drivers license and is doing well in college. She is so anxious about where she is living and rent increase. This keeps her staying anxious. She is cutting costs wherever she can.   Uncontrollable urination at night is interfering with her sleep. Does have some urgency in day, but seems worse at night. Only drinks water. Drinks all day long - 3-4 bottles daily. Doesn't cut self off in evening. Gets up 4-5 times/night. No discomfort with urination. No issues with leaking urine unless she has a cough. Has been on detrol for years. Had testing with urology in the past. Quit going to urology because they wouldn't keep her on the detrol when plan wouldn't cover this.   She has mark on belly that she felt was ringworm. Bought lortrimin. Thinks bandaid aggravated around this area.    Allergies  Allergen  Reactions   Elemental Sulfur    Lipitor [Atorvastatin]     Cramping muscles   Pravastatin     cramping   Sulfa Antibiotics    Current Meds  Medication Sig   busPIRone (BUSPAR) 15 MG tablet TAKE 1 TABLET BY MOUTH TWICE A DAY   Cobalamin Combinations (B-12) 1000-400 MCG SUBL Place under the tongue daily.   febuxostat (ULORIC) 40 MG tablet Take 40 mg by mouth daily.   fexofenadine (ALLEGRA) 180 MG tablet TAKE 1 TABLET BY MOUTH EVERY DAY   levothyroxine (SYNTHROID) 100 MCG tablet TAKE 1 TABLET BY MOUTH EVERY DAY   omeprazole (PRILOSEC) 40 MG capsule TAKE 1 CAPSULE BY MOUTH EVERY DAY   pregabalin (LYRICA) 225 MG capsule Take 1 capsule (225 mg total) by mouth 2 (two) times daily.   rosuvastatin (CRESTOR) 5 MG tablet Take 0.5 tablets (2.5 mg total) by mouth daily.   sertraline (ZOLOFT) 100 MG tablet TAKE 1 AND 1/2 TABLETS BY MOUTH EVERY DAY   tolterodine (DETROL) 2 MG tablet Take 1 tablet (2 mg total) by mouth 2 (two) times daily.   Vitamin D, Ergocalciferol, (DRISDOL) 1.25 MG (50000 UNIT) CAPS capsule TAKE 1 CAPSULE (50,000 UNITS TOTAL) BY MOUTH EVERY 7 (SEVEN) DAYS    Review of Systems  Constitutional:  Negative for chills, fatigue and fever.  Respiratory:  Negative for cough, chest tightness, shortness of breath and wheezing.   Cardiovascular:  Negative for chest pain, palpitations and leg swelling.  Skin:  Positive for rash (small area 2cm abdomen).   Objective:  LMP 05/06/2006 (Approximate)       BP Readings from Last 3 Encounters:  01/15/21 (!) 131/57  01/12/21 (!) 148/80  11/15/20 124/68   Wt Readings from Last 3 Encounters:  01/15/21 194 lb 6.4 oz (88.2 kg)  01/12/21 194 lb (88 kg)  11/15/20 196 lb 4.8 oz (89 kg)    EXAM:  GENERAL: alert, oriented, appears well and in no acute distress  HEENT: atraumatic, conjunctiva clear, no obvious abnormalities on inspection of external nose and ears  NECK: normal movements of the head and neck  LUNGS: on inspection no signs  of respiratory distress, breathing rate appears normal, no obvious gross SOB, gasping or wheezing  CV: no obvious cyanosis  MS: moves all visible extremities without noticeable abnormality  PSYCH/NEURO: pleasant and cooperative, no obvious depression or anxiety, speech and thought processing grossly intact   Assessment/Plan  1. Acquired hypothyroidism Thyroid has been stable on 17mcg of synthroid.   2. Gastroesophageal reflux disease, unspecified whether esophagitis present Stable on prilosec. Continue 40mg . Suspec this will improve as she continues to lose weight.  3. Anxiety Stable, chronic. Zoloft 150mg  daily.   4. Depression, recurrent ([facility]) Stable, chronic. See above.   5. Hyperlipidemia, unspecified hyperlipidemia type Tolerating crestor with great lipid results. LDL currently 119. Will stay on current dose unless other risk factors increase (ie sugar)  6. Hyperglycemia Diet controlled. Working on regular exercise. Working with nutritionist.   7. Vitamin D deficiency Taking supplement replacement 50,000 units weekly.  8. Urinary frequency At night time. R/o infection; then if we have myrbetriq samples will give this to try. Consider urology if not improving. - Urinalysis; Future - Urine Culture; Future   Return for for urine culture when able..    I discussed the assessment and treatment plan with the patient. The patient was provided an opportunity to ask questions and all were answered. The patient agreed with the plan and demonstrated an understanding of the instructions.   The patient was advised to call back or seek an in-person evaluation if the symptoms worsen or if the condition fails to improve as anticipated.  I provided 30 minutes of face-to-face time during this encounter.   Micheline Rough, MD

## 2021-02-16 NOTE — Telephone Encounter (Signed)
Spoke with the patient and scheduled a lab appt for 10/24.  Patient is aware a sample was left at the front desk for pick up.

## 2021-02-16 NOTE — Telephone Encounter (Signed)
-----   Message from Caren Macadam, MD sent at 02/16/2021  1:36 PM EDT ----- Please set up patient for lab visit (ua/culture ordered). She also wants to schedule CCV in future. If we have myrbetriq sample, please put 25mg  sample up front for her as well to take instead of the detrol. Thanks!

## 2021-02-16 NOTE — Addendum Note (Signed)
Addended by: Agnes Lawrence on: 02/16/2021 03:04 PM   Modules accepted: Orders

## 2021-02-16 NOTE — Telephone Encounter (Signed)
Noted. Patient was made aware that this was not likely to get added on. We will instead add to future bloodwork.

## 2021-02-16 NOTE — Telephone Encounter (Signed)
Per tech at the [facility] lab, test cannot be performed as specimens are only kept for 7 days.  Message sent to PCP.

## 2021-02-16 NOTE — Telephone Encounter (Signed)
-----   Message from Caren Macadam, MD sent at 02/16/2021  1:17 PM EDT ----- Does lab still have her blood - she wanted uric acid level checked due to gout medication. If they can't we will just get on next bloodwork.

## 2021-02-26 ENCOUNTER — Other Ambulatory Visit: Payer: Self-pay

## 2021-02-26 ENCOUNTER — Other Ambulatory Visit (INDEPENDENT_AMBULATORY_CARE_PROVIDER_SITE_OTHER): Payer: Medicare Other

## 2021-02-26 DIAGNOSIS — R350 Frequency of micturition: Secondary | ICD-10-CM | POA: Diagnosis not present

## 2021-02-26 LAB — URINALYSIS, ROUTINE W REFLEX MICROSCOPIC
Bilirubin Urine: NEGATIVE
Ketones, ur: NEGATIVE
Nitrite: NEGATIVE
RBC / HPF: NONE SEEN (ref 0–?)
Specific Gravity, Urine: 1.01 (ref 1.000–1.030)
Total Protein, Urine: NEGATIVE
Urine Glucose: NEGATIVE
Urobilinogen, UA: 0.2 (ref 0.0–1.0)
pH: 6.5 (ref 5.0–8.0)

## 2021-02-27 LAB — URINE CULTURE
MICRO NUMBER:: 12541610
SPECIMEN QUALITY:: ADEQUATE

## 2021-03-05 ENCOUNTER — Other Ambulatory Visit: Payer: Self-pay

## 2021-03-06 ENCOUNTER — Telehealth: Payer: Self-pay | Admitting: Family Medicine

## 2021-03-06 NOTE — Telephone Encounter (Signed)
Patient called because mark on stomach has no gotten better, patient states it looks like it has gotten bigger. Patient would like to know if there is anything else she can buy or something Dr.Koberlein could prescribe to help. Patient states they had discussed mark on stomach in previous visit and she was told to call if the treatment did not help. Patient is scheduled for 11/16.       Good callback number is [phone]

## 2021-03-06 NOTE — Telephone Encounter (Signed)
Spoke with the patient and informed her a visit is needed for evaluation prior to medications being given for a rash.  I offered an in office appt with another provider, patient declined due to lack of transportation and I scheduled an appt on 11/3 with Dr Maudie Mercury as PCP is out of the office until next week.

## 2021-03-08 ENCOUNTER — Telehealth: Payer: Medicare Other | Admitting: Family Medicine

## 2021-03-08 ENCOUNTER — Encounter: Payer: Self-pay | Admitting: Registered"

## 2021-03-08 NOTE — Progress Notes (Signed)
On 02/16/21 patient completed the Diabetes Prevention Program course virtually with [facility]. By the end of this session patients are able to complete the following objectives:   Virtual Visit via Video Note  I connected with Samantha Burch by a video enabled application and verified that I am speaking with the correct person using two identifiers.  Location: Patient: Home.  Provider: Office.   Learning Objectives: Define fiber and describe the difference between insoluble and soluble fiber  List foods that are good sources of fiber Explain the health benefits of fiber  Describe ways to increase volume of meals and snacks while staying within fat goal.   Goals:  Record weight taken outside of class.  Track foods and beverages eaten each day in the "Food and Activity Tracker," including calories and fat grams for each item.   Track activity type, minutes you were active, and distance you reached each day in the "Food and Activity Tracker."   Follow-Up Plan: Attend next session.  Email completed "Food and Activity Trackers" before next session to be reviewed by Lifestyle Coach.

## 2021-03-15 ENCOUNTER — Encounter: Payer: Self-pay | Admitting: Neurology

## 2021-03-21 ENCOUNTER — Ambulatory Visit: Payer: Medicare Other | Admitting: Family Medicine

## 2021-03-22 DIAGNOSIS — Z23 Encounter for immunization: Secondary | ICD-10-CM | POA: Diagnosis not present

## 2021-04-13 DIAGNOSIS — Z1231 Encounter for screening mammogram for malignant neoplasm of breast: Secondary | ICD-10-CM | POA: Diagnosis not present

## 2021-04-13 LAB — HM MAMMOGRAPHY

## 2021-04-17 ENCOUNTER — Encounter: Payer: Self-pay | Admitting: Family Medicine

## 2021-04-30 ENCOUNTER — Other Ambulatory Visit: Payer: Self-pay | Admitting: Family Medicine

## 2021-04-30 DIAGNOSIS — E785 Hyperlipidemia, unspecified: Secondary | ICD-10-CM

## 2021-05-18 ENCOUNTER — Encounter: Payer: Medicare Other | Attending: Family Medicine | Admitting: Registered"

## 2021-05-18 DIAGNOSIS — R7303 Prediabetes: Secondary | ICD-10-CM | POA: Insufficient documentation

## 2021-05-25 NOTE — Progress Notes (Signed)
On 05/18/21 patient completed a post core session of the Diabetes Prevention Program course virtually with [facility]. By the end of this session patients are able to complete the following objectives:   Virtual Visit via Video Note  I connected with Samantha Burch by a video enabled application and verified that I am speaking with the correct person using two identifiers.  Location: Patient: Home.  Provider: Office.   Learning Objectives: Counter self-defeating thoughts with positive self-statements Define assertiveness.  List examples of ways to practice assertiveness.   Goals:  Record weight taken outside of class.  Track foods and beverages eaten each day in the "Food and Activity Tracker," including calories and fat grams for each item.   Track activity type, minutes you were active, and distance you reached each day in the "Food and Activity Tracker."   Follow-Up Plan: Attend next session.  Email completed "Food and Activity Trackers" before next session to be reviewed by Lifestyle Coach.

## 2021-06-15 ENCOUNTER — Encounter: Payer: Medicare Other | Admitting: Registered"

## 2021-06-30 ENCOUNTER — Other Ambulatory Visit: Payer: Self-pay | Admitting: Family Medicine

## 2021-06-30 DIAGNOSIS — J3089 Other allergic rhinitis: Secondary | ICD-10-CM

## 2021-07-09 ENCOUNTER — Other Ambulatory Visit: Payer: Self-pay | Admitting: Family Medicine

## 2021-07-09 DIAGNOSIS — F419 Anxiety disorder, unspecified: Secondary | ICD-10-CM

## 2021-07-09 DIAGNOSIS — F32A Depression, unspecified: Secondary | ICD-10-CM

## 2021-07-09 DIAGNOSIS — E039 Hypothyroidism, unspecified: Secondary | ICD-10-CM

## 2021-07-12 DIAGNOSIS — Z6837 Body mass index (BMI) 37.0-37.9, adult: Secondary | ICD-10-CM | POA: Diagnosis not present

## 2021-07-12 DIAGNOSIS — E669 Obesity, unspecified: Secondary | ICD-10-CM | POA: Diagnosis not present

## 2021-07-12 DIAGNOSIS — M2550 Pain in unspecified joint: Secondary | ICD-10-CM | POA: Diagnosis not present

## 2021-07-12 DIAGNOSIS — R2231 Localized swelling, mass and lump, right upper limb: Secondary | ICD-10-CM | POA: Diagnosis not present

## 2021-07-12 DIAGNOSIS — R7989 Other specified abnormal findings of blood chemistry: Secondary | ICD-10-CM | POA: Diagnosis not present

## 2021-07-12 DIAGNOSIS — M1A09X0 Idiopathic chronic gout, multiple sites, without tophus (tophi): Secondary | ICD-10-CM | POA: Diagnosis not present

## 2021-07-30 ENCOUNTER — Telehealth: Payer: Self-pay | Admitting: Family Medicine

## 2021-07-30 NOTE — Telephone Encounter (Signed)
Left message for patient to call back and schedule Medicare Annual Wellness Visit (AWV) either virtually or in office. Left  my jabber number [phone]  AWV-I per [place] 07/04/16  ; please schedule at anytime with [facility] Nurse Health Advisor 1 or 2   This should be a 45 minute visit.  

## 2021-09-07 ENCOUNTER — Ambulatory Visit (INDEPENDENT_AMBULATORY_CARE_PROVIDER_SITE_OTHER): Payer: Medicare Other | Admitting: Family Medicine

## 2021-09-07 ENCOUNTER — Encounter: Payer: Self-pay | Admitting: Family Medicine

## 2021-09-07 VITALS — BP 122/82 | HR 67 | Temp 97.5°F | Ht 61.5 in | Wt 198.6 lb

## 2021-09-07 DIAGNOSIS — M109 Gout, unspecified: Secondary | ICD-10-CM | POA: Diagnosis not present

## 2021-09-07 DIAGNOSIS — N3281 Overactive bladder: Secondary | ICD-10-CM

## 2021-09-07 DIAGNOSIS — K219 Gastro-esophageal reflux disease without esophagitis: Secondary | ICD-10-CM | POA: Diagnosis not present

## 2021-09-07 DIAGNOSIS — E559 Vitamin D deficiency, unspecified: Secondary | ICD-10-CM

## 2021-09-07 DIAGNOSIS — E039 Hypothyroidism, unspecified: Secondary | ICD-10-CM

## 2021-09-07 DIAGNOSIS — Z23 Encounter for immunization: Secondary | ICD-10-CM

## 2021-09-07 DIAGNOSIS — F419 Anxiety disorder, unspecified: Secondary | ICD-10-CM | POA: Diagnosis not present

## 2021-09-07 DIAGNOSIS — E785 Hyperlipidemia, unspecified: Secondary | ICD-10-CM

## 2021-09-07 DIAGNOSIS — E538 Deficiency of other specified B group vitamins: Secondary | ICD-10-CM

## 2021-09-07 DIAGNOSIS — F339 Major depressive disorder, recurrent, unspecified: Secondary | ICD-10-CM | POA: Diagnosis not present

## 2021-09-07 DIAGNOSIS — F32A Depression, unspecified: Secondary | ICD-10-CM

## 2021-09-07 DIAGNOSIS — R739 Hyperglycemia, unspecified: Secondary | ICD-10-CM | POA: Diagnosis not present

## 2021-09-07 MED ORDER — OMEPRAZOLE 40 MG PO CPDR: 40.0000 mg | DELAYED_RELEASE_CAPSULE | Freq: Every day | ORAL | 1 refills | Status: DC

## 2021-09-07 MED ORDER — LEVOTHYROXINE SODIUM 100 MCG PO TABS: 100.0000 ug | ORAL_TABLET | Freq: Every day | ORAL | 1 refills | Status: DC

## 2021-09-07 MED ORDER — SERTRALINE HCL 100 MG PO TABS: ORAL_TABLET | ORAL | 1 refills | Status: DC

## 2021-09-07 MED ORDER — ROSUVASTATIN CALCIUM 5 MG PO TABS: ORAL_TABLET | ORAL | 1 refills | Status: DC

## 2021-09-07 NOTE — Progress Notes (Signed)
?Ruel Burch ?DOB: [birth year] ?Encounter date: 09/07/2021 ? ?This is a 66 y.o. female who presents with ?Chief Complaint  ?Patient presents with  ? Follow-up  ? ? ?History of present illness: ?Last visit with me was 02/16/2021. In November son had wreck after just learning to drive. Son ok, but car totaled. This was financially stressful because in addition to new car her insurance went up as well. She resorted to what she always does - blaming self. Because of this she sort of fell off wagon of doing her healthy living. Son has a year left in school and he has a 4.0; he is a communications major.  ? ?Hyperglycemia:was doing well with diet control; has fallen off a bit. ?Hyperlipidemia: Crestor 2.5 mg 3 times a week ?Anxiety/depression: BuSpar 15 mg twice daily, Zoloft ?Hypothyroid: Synthroid 100 mcg daily ?Overactive bladder: Myrbetriq 25 mg daily was tried but not better. Detrol LA '2mg'$  BID. She does have dry mouth, dry skin. Still has to go frequently; not leaking. Used to leak more with cough.  ?Seasonal allergies: Allegra ?Gout: Uloric 40 mg daily ?GERD: Omeprazole 40 mg daily ? ?Allergies  ?Allergen Reactions  ? Elemental Sulfur   ? Lipitor [Atorvastatin]   ?  Cramping muscles  ? Pravastatin   ?  cramping  ? Sulfa Antibiotics   ? ?Current Meds  ?Medication Sig  ? busPIRone (BUSPAR) 15 MG tablet TAKE 1 TABLET BY MOUTH TWICE A DAY  ? Cobalamin Combinations (B-12) 1000-400 MCG SUBL Place under the tongue daily.  ? febuxostat (ULORIC) 40 MG tablet Take 40 mg by mouth daily.  ? fexofenadine (ALLEGRA) 180 MG tablet TAKE 1 TABLET BY MOUTH EVERY DAY  ? levothyroxine (SYNTHROID) 100 MCG tablet TAKE 1 TABLET BY MOUTH EVERY DAY  ? omeprazole (PRILOSEC) 40 MG capsule TAKE 1 CAPSULE BY MOUTH EVERY DAY  ? pregabalin (LYRICA) 225 MG capsule Take 1 capsule (225 mg total) by mouth 2 (two) times daily.  ? rosuvastatin (CRESTOR) 5 MG tablet TAKE 0.5 TABLETS (2.5 MG TOTAL) BY MOUTH 3 (THREE) TIMES A WEEK.  ? sertraline  (ZOLOFT) 100 MG tablet TAKE 1.5 TABLETS BY MOUTH EVERY DAY  ? tolterodine (DETROL) 2 MG tablet Take 1 tablet (2 mg total) by mouth 2 (two) times daily.  ? Vitamin D, Ergocalciferol, (DRISDOL) 1.25 MG (50000 UNIT) CAPS capsule TAKE 1 CAPSULE (50,000 UNITS TOTAL) BY MOUTH EVERY 7 (SEVEN) DAYS  ? ? ?Review of Systems  ?Constitutional:  Negative for chills, fatigue and fever.  ?Respiratory:  Negative for cough, chest tightness, shortness of breath and wheezing.   ?Cardiovascular:  Negative for chest pain, palpitations and leg swelling.  ? ?Objective: ? ?BP 122/82 (BP Location: Left Arm, Patient Position: Sitting, Cuff Size: Large)   Pulse 67   Temp (!) 97.5 ?F (36.4 ?C) (Oral)   Ht 5' 1.5" (1.562 m)   Wt 198 lb 9.6 oz (90.1 kg)   LMP 05/06/2006 (Approximate)   SpO2 98%   BMI 36.92 kg/m?   Weight: 198 lb 9.6 oz (90.1 kg)  ? ?BP Readings from Last 3 Encounters:  ?09/07/21 122/82  ?01/15/21 (!) 131/57  ?01/12/21 (!) 148/80  ? ?Wt Readings from Last 3 Encounters:  ?09/07/21 198 lb 9.6 oz (90.1 kg)  ?01/15/21 194 lb 6.4 oz (88.2 kg)  ?01/12/21 194 lb (88 kg)  ? ? ?Physical Exam ?Constitutional:   ?   General: She is not in acute distress. ?   Appearance: She is well-developed.  ?Cardiovascular:  ?  Rate and Rhythm: Normal rate and regular rhythm.  ?   Heart sounds: Normal heart sounds. No murmur heard. ?  No friction rub.  ?Pulmonary:  ?   Effort: Pulmonary effort is normal. No respiratory distress.  ?   Breath sounds: Normal breath sounds. No wheezing or rales.  ?Musculoskeletal:  ?   Right lower leg: No edema.  ?   Left lower leg: No edema.  ?Neurological:  ?   Mental Status: She is alert and oriented to person, place, and time.  ?Psychiatric:     ?   Behavior: Behavior normal.  ? ? ?Assessment/Plan ?1. Acquired hypothyroidism ?- TSH; Future ?- levothyroxine (SYNTHROID) 100 MCG tablet; Take 1 tablet (100 mcg total) by mouth daily.  Dispense: 90 tablet; Refill: 1 ? ?2. Depression, recurrent ([place]) ?Mood is stable.  She has had a little harder time lately, but is motivated to get back ontrack with health.  ? ?3. Gastroesophageal reflux disease, unspecified whether esophagitis present ?Continue with prilosec daily ? ?4. Overactive bladder ?Myrbetriq '50mg'$  daily samples given. She will let me know how she does with this.  ? ?5. Anxiety ?Stable. Continue with current medication.  ? ?6. Gout, unspecified cause, unspecified chronicity, unspecified site ?Continue with allopurinol. ?- Uric acid; Future ? ?7. B12 deficiency ?- CBC with Differential/Platelet; Future ? ?8. Vitamin D deficiency ?Levels have beenstable. ? ?9. Hyperglycemia ?- Hemoglobin A1c; Future ? ?10. Hyperlipidemia, unspecified hyperlipidemia type ?- Comprehensive metabolic panel; Future ?- Lipid panel; Future ?- rosuvastatin (CRESTOR) 5 MG tablet; TAKE 0.5 TABLETS (2.5 MG TOTAL) BY MOUTH 3 (THREE) TIMES A WEEK.  Dispense: 90 tablet; Refill: 1 ? ?11. Depression, unspecified depression type ?- sertraline (ZOLOFT) 100 MG tablet; TAKE 1.5 TABLETS BY MOUTH EVERY DAY  Dispense: 135 tablet; Refill: 1 ? ?12. Need for pneumococcal 20-valent conjugate vaccination ?- Pneumococcal conjugate vaccine 20-valent (Prevnar 20) ? ? ? ?Return for pending lab or imaging results. ?She will set up shingles vaccine at pharmacy ? ? ? ?Micheline Rough, MD ?

## 2021-09-17 ENCOUNTER — Other Ambulatory Visit (INDEPENDENT_AMBULATORY_CARE_PROVIDER_SITE_OTHER): Payer: Medicare Other

## 2021-09-17 DIAGNOSIS — M109 Gout, unspecified: Secondary | ICD-10-CM | POA: Diagnosis not present

## 2021-09-17 DIAGNOSIS — R739 Hyperglycemia, unspecified: Secondary | ICD-10-CM

## 2021-09-17 DIAGNOSIS — E538 Deficiency of other specified B group vitamins: Secondary | ICD-10-CM | POA: Diagnosis not present

## 2021-09-17 DIAGNOSIS — E785 Hyperlipidemia, unspecified: Secondary | ICD-10-CM | POA: Diagnosis not present

## 2021-09-17 DIAGNOSIS — E039 Hypothyroidism, unspecified: Secondary | ICD-10-CM | POA: Diagnosis not present

## 2021-09-17 LAB — CBC WITH DIFFERENTIAL/PLATELET
Basophils Absolute: 0.1 10*3/uL (ref 0.0–0.1)
Basophils Relative: 1.1 % (ref 0.0–3.0)
Eosinophils Absolute: 0.1 10*3/uL (ref 0.0–0.7)
Eosinophils Relative: 2.3 % (ref 0.0–5.0)
HCT: 39.1 % (ref 36.0–46.0)
Hemoglobin: 13.5 g/dL (ref 12.0–15.0)
Lymphocytes Relative: 50 % — ABNORMAL HIGH (ref 12.0–46.0)
Lymphs Abs: 3.2 10*3/uL (ref 0.7–4.0)
MCHC: 34.4 g/dL (ref 30.0–36.0)
MCV: 86.5 fl (ref 78.0–100.0)
Monocytes Absolute: 0.6 10*3/uL (ref 0.1–1.0)
Monocytes Relative: 9 % (ref 3.0–12.0)
Neutro Abs: 2.4 10*3/uL (ref 1.4–7.7)
Neutrophils Relative %: 37.6 % — ABNORMAL LOW (ref 43.0–77.0)
Platelets: 189 10*3/uL (ref 150.0–400.0)
RBC: 4.52 Mil/uL (ref 3.87–5.11)
RDW: 12.9 % (ref 11.5–15.5)
WBC: 6.4 10*3/uL (ref 4.0–10.5)

## 2021-09-17 LAB — COMPREHENSIVE METABOLIC PANEL
ALT: 19 U/L (ref 0–35)
AST: 21 U/L (ref 0–37)
Albumin: 4.3 g/dL (ref 3.5–5.2)
Alkaline Phosphatase: 72 U/L (ref 39–117)
BUN: 13 mg/dL (ref 6–23)
CO2: 30 mEq/L (ref 19–32)
Calcium: 9.5 mg/dL (ref 8.4–10.5)
Chloride: 102 mEq/L (ref 96–112)
Creatinine, Ser: 0.74 mg/dL (ref 0.40–1.20)
GFR: 84.99 mL/min (ref >60.00)
Glucose, Bld: 91 mg/dL (ref 70–99)
Potassium: 4.3 mEq/L (ref 3.5–5.1)
Sodium: 140 mEq/L (ref 135–145)
Total Bilirubin: 0.5 mg/dL (ref 0.2–1.2)
Total Protein: 7 g/dL (ref 6.0–8.3)

## 2021-09-17 LAB — LIPID PANEL
Cholesterol: 196 mg/dL (ref 0–200)
HDL: 48.4 mg/dL (ref >39.00)
LDL Cholesterol: 112 mg/dL — ABNORMAL HIGH (ref 0–99)
NonHDL: 147.53
Total CHOL/HDL Ratio: 4
Triglycerides: 179 mg/dL — ABNORMAL HIGH (ref 0.0–149.0)
VLDL: 35.8 mg/dL (ref 0.0–40.0)

## 2021-09-17 LAB — URIC ACID: Uric Acid, Serum: 2.2 mg/dL — ABNORMAL LOW (ref 2.4–7.0)

## 2021-09-17 LAB — TSH: TSH: 0.75 u[IU]/mL (ref 0.35–5.50)

## 2021-09-17 LAB — HEMOGLOBIN A1C: Hgb A1c MFr Bld: 6.1 % (ref 4.6–6.5)

## 2021-09-21 ENCOUNTER — Telehealth: Payer: Self-pay | Admitting: Family Medicine

## 2021-09-21 ENCOUNTER — Other Ambulatory Visit: Payer: Self-pay | Admitting: Family Medicine

## 2021-09-21 MED ORDER — MIRABEGRON ER 50 MG PO TB24: 50.0000 mg | ORAL_TABLET | Freq: Every day | ORAL | 1 refills | Status: DC

## 2021-09-21 NOTE — Telephone Encounter (Signed)
I have sent the myrbetriq for her. She did not get improvement with the detrol in the past or the oxybutynin in the past and the myrbetriq appears to be a step medication for her. The myrbetriq is the first med that has worked. She slept through the night.If this comes through needing approval, I wanted you to be aware.   Can you please forward her uric acid level to michelle young in rheumatology?   We dicussed follow up CBC with diff in a few months.

## 2021-09-21 NOTE — Telephone Encounter (Signed)
Pt call and stated she want to take the medication Murbetiriq 50 mg and want it sent to  [facility], [place] Phone:  [phone]  Fax:  [phone]    Pt also stated she need a call back today to  discuss some labs work .

## 2021-09-24 NOTE — Telephone Encounter (Signed)
Uric acid results routed to Dr Annamaria Boots via [place].

## 2021-10-04 ENCOUNTER — Ambulatory Visit: Payer: Medicare Other

## 2021-10-09 ENCOUNTER — Other Ambulatory Visit: Payer: Self-pay | Admitting: Family Medicine

## 2021-10-09 DIAGNOSIS — F419 Anxiety disorder, unspecified: Secondary | ICD-10-CM

## 2021-10-10 ENCOUNTER — Telehealth: Payer: Self-pay | Admitting: Family Medicine

## 2021-10-10 NOTE — Telephone Encounter (Signed)
Left message for patient to call back and schedule Medicare Annual Wellness Visit (AWV) either virtually or in office. Left  my jabber number [phone]  AWV-I per [place] 07/04/16  ; please schedule at anytime with [facility] Nurse Health Advisor 1 or 2   This should be a 45 minute visit.  

## 2021-10-11 ENCOUNTER — Encounter: Payer: Self-pay | Admitting: Neurology

## 2021-10-15 DIAGNOSIS — M67441 Ganglion, right hand: Secondary | ICD-10-CM | POA: Diagnosis not present

## 2021-10-15 DIAGNOSIS — M79644 Pain in right finger(s): Secondary | ICD-10-CM | POA: Diagnosis not present

## 2021-11-12 DIAGNOSIS — Z23 Encounter for immunization: Secondary | ICD-10-CM | POA: Diagnosis not present

## 2021-11-19 ENCOUNTER — Telehealth: Payer: Self-pay | Admitting: Family Medicine

## 2021-11-19 NOTE — Telephone Encounter (Signed)
Left message for patient to call back and schedule Medicare Annual Wellness Visit (AWV) either virtually or in office. Left  my Samantha Burch number [phone]   AWV-I per [place] 07/04/16  please schedule at anytime with [facility] Nurse Health Advisor 1 or 2   This should be a 45 minute visit.

## 2021-12-19 ENCOUNTER — Telehealth: Payer: Self-pay | Admitting: Family Medicine

## 2021-12-19 NOTE — Telephone Encounter (Signed)
Left message for patient to call back and schedule Medicare Annual Wellness Visit (AWV) either virtually or in office. Left  my Herbie Drape number [phone]  AWV-I per [place] 07/04/16  ; please schedule at anytime with [facility] Nurse Health Advisor 1 or 2   This should be a 45 minute visit.

## 2022-01-14 DIAGNOSIS — R7989 Other specified abnormal findings of blood chemistry: Secondary | ICD-10-CM | POA: Diagnosis not present

## 2022-01-14 DIAGNOSIS — Z6837 Body mass index (BMI) 37.0-37.9, adult: Secondary | ICD-10-CM | POA: Diagnosis not present

## 2022-01-14 DIAGNOSIS — E669 Obesity, unspecified: Secondary | ICD-10-CM | POA: Diagnosis not present

## 2022-01-14 DIAGNOSIS — R2231 Localized swelling, mass and lump, right upper limb: Secondary | ICD-10-CM | POA: Diagnosis not present

## 2022-01-14 DIAGNOSIS — M2550 Pain in unspecified joint: Secondary | ICD-10-CM | POA: Diagnosis not present

## 2022-01-14 DIAGNOSIS — M1A09X0 Idiopathic chronic gout, multiple sites, without tophus (tophi): Secondary | ICD-10-CM | POA: Diagnosis not present

## 2022-01-21 ENCOUNTER — Ambulatory Visit: Payer: Medicare Other | Admitting: Neurology

## 2022-01-22 ENCOUNTER — Ambulatory Visit: Payer: Medicare Other | Admitting: Neurology

## 2022-01-24 ENCOUNTER — Ambulatory Visit (INDEPENDENT_AMBULATORY_CARE_PROVIDER_SITE_OTHER): Payer: Medicare Other

## 2022-01-24 ENCOUNTER — Encounter: Payer: Self-pay | Admitting: Family Medicine

## 2022-01-24 ENCOUNTER — Other Ambulatory Visit: Payer: Self-pay | Admitting: Family Medicine

## 2022-01-24 ENCOUNTER — Ambulatory Visit (INDEPENDENT_AMBULATORY_CARE_PROVIDER_SITE_OTHER): Payer: Medicare Other | Admitting: Family Medicine

## 2022-01-24 VITALS — BP 142/60 | HR 67 | Temp 97.5°F | Ht 61.5 in | Wt 197.0 lb

## 2022-01-24 VITALS — BP 142/60 | HR 67 | Temp 97.5°F | Ht 61.5 in | Wt 197.4 lb

## 2022-01-24 DIAGNOSIS — N3281 Overactive bladder: Secondary | ICD-10-CM | POA: Diagnosis not present

## 2022-01-24 DIAGNOSIS — E785 Hyperlipidemia, unspecified: Secondary | ICD-10-CM | POA: Diagnosis not present

## 2022-01-24 DIAGNOSIS — E039 Hypothyroidism, unspecified: Secondary | ICD-10-CM | POA: Diagnosis not present

## 2022-01-24 DIAGNOSIS — F419 Anxiety disorder, unspecified: Secondary | ICD-10-CM

## 2022-01-24 DIAGNOSIS — R7303 Prediabetes: Secondary | ICD-10-CM | POA: Diagnosis not present

## 2022-01-24 DIAGNOSIS — Z780 Asymptomatic menopausal state: Secondary | ICD-10-CM | POA: Diagnosis not present

## 2022-01-24 DIAGNOSIS — Z1211 Encounter for screening for malignant neoplasm of colon: Secondary | ICD-10-CM

## 2022-01-24 DIAGNOSIS — F339 Major depressive disorder, recurrent, unspecified: Secondary | ICD-10-CM

## 2022-01-24 DIAGNOSIS — K219 Gastro-esophageal reflux disease without esophagitis: Secondary | ICD-10-CM | POA: Diagnosis not present

## 2022-01-24 DIAGNOSIS — R030 Elevated blood-pressure reading, without diagnosis of hypertension: Secondary | ICD-10-CM

## 2022-01-24 DIAGNOSIS — E559 Vitamin D deficiency, unspecified: Secondary | ICD-10-CM

## 2022-01-24 DIAGNOSIS — Z23 Encounter for immunization: Secondary | ICD-10-CM

## 2022-01-24 DIAGNOSIS — F32A Depression, unspecified: Secondary | ICD-10-CM

## 2022-01-24 DIAGNOSIS — Z0000 Encounter for general adult medical examination without abnormal findings: Secondary | ICD-10-CM | POA: Diagnosis not present

## 2022-01-24 LAB — POCT GLYCOSYLATED HEMOGLOBIN (HGB A1C): Hemoglobin A1C: 5.8 % — AB (ref 4.0–5.6)

## 2022-01-24 MED ORDER — MIRABEGRON ER 50 MG PO TB24: 50.0000 mg | ORAL_TABLET | Freq: Every day | ORAL | 1 refills | Status: DC

## 2022-01-24 MED ORDER — SERTRALINE HCL 100 MG PO TABS: ORAL_TABLET | ORAL | 1 refills | Status: DC

## 2022-01-24 MED ORDER — LEVOTHYROXINE SODIUM 100 MCG PO TABS: 100.0000 ug | ORAL_TABLET | Freq: Every day | ORAL | 1 refills | Status: DC

## 2022-01-24 MED ORDER — ROSUVASTATIN CALCIUM 5 MG PO TABS: ORAL_TABLET | ORAL | 1 refills | Status: DC

## 2022-01-24 MED ORDER — OMEPRAZOLE 40 MG PO CPDR: 40.0000 mg | DELAYED_RELEASE_CAPSULE | Freq: Every day | ORAL | 1 refills | Status: DC

## 2022-01-24 MED ORDER — VITAMIN D (ERGOCALCIFEROL) 1.25 MG (50000 UNIT) PO CAPS: 50000.0000 [IU] | ORAL_CAPSULE | ORAL | 1 refills | Status: DC

## 2022-01-24 MED ORDER — BUSPIRONE HCL 15 MG PO TABS: 15.0000 mg | ORAL_TABLET | Freq: Two times a day (BID) | ORAL | 3 refills | Status: DC

## 2022-01-24 NOTE — Patient Instructions (Signed)
Check your blood pressure every day for the next 2-3 weeks. Message me some of the readings on My Chart. Your goal blood pressure is anything less than 140/90.

## 2022-01-24 NOTE — Patient Instructions (Addendum)
Samantha Burch , Thank you for taking time to come for your Medicare Wellness Visit. I appreciate your ongoing commitment to your health goals. Please review the following plan we discussed and let me know if I can assist you in the future.   These are the goals we discussed:  Goals       Increase physical activity (pt-stated)        This is a list of the screening recommended for you and due dates:  Health Maintenance  Topic Date Due   DEXA scan (bone density measurement)  Never done   COVID-19 Vaccine (5 - Pfizer risk series) 02/09/2022*   Zoster (Shingles) Vaccine (1 of 2) 04/25/2022*   Colon Cancer Screening  01/25/2023*   Tetanus Vaccine  01/25/2023*   Hepatitis C Screening: USPSTF Recommendation to screen - Ages 18-79 yo.  01/25/2023*   HIV Screening  01/25/2023*   Mammogram  04/13/2022   Pap Smear  04/06/2023   Pneumonia Vaccine  Completed   Flu Shot  Completed   HPV Vaccine  Aged Out  *Topic was postponed. The date shown is not the original due date.    Advanced directives: Advance directive discussed with you today. Even though you declined this today, please call our office should you change your mind, and we can give you the proper paperwork for you to fill out.   Conditions/risks identified: None  Next appointment: Follow up in one year for your annual wellness visit     Preventive Care 65 Years and Older, Female Preventive care refers to lifestyle choices and visits with your health care provider that can promote health and wellness. What does preventive care include? A yearly physical exam. This is also called an annual well check. Dental exams once or twice a year. Routine eye exams. Ask your health care provider how often you should have your eyes checked. Personal lifestyle choices, including: Daily care of your teeth and gums. Regular physical activity. Eating a healthy diet. Avoiding tobacco and drug use. Limiting alcohol use. Practicing safe  sex. Taking low-dose aspirin every day. Taking vitamin and mineral supplements as recommended by your health care provider. What happens during an annual well check? The services and screenings done by your health care provider during your annual well check will depend on your age, overall health, lifestyle risk factors, and family history of disease. Counseling  Your health care provider may ask you questions about your: Alcohol use. Tobacco use. Drug use. Emotional well-being. Home and relationship well-being. Sexual activity. Eating habits. History of falls. Memory and ability to understand (cognition). Work and work Statistician. Reproductive health. Screening  You may have the following tests or measurements: Height, weight, and BMI. Blood pressure. Lipid and cholesterol levels. These may be checked every 5 years, or more frequently if you are over 54 years old. Skin check. Lung cancer screening. You may have this screening every year starting at age 18 if you have a 30-pack-year history of smoking and currently smoke or have quit within the past 15 years. Fecal occult blood test (FOBT) of the stool. You may have this test every year starting at age 55. Flexible sigmoidoscopy or colonoscopy. You may have a sigmoidoscopy every 5 years or a colonoscopy every 10 years starting at age 35. Hepatitis C blood test. Hepatitis B blood test. Sexually transmitted disease (STD) testing. Diabetes screening. This is done by checking your blood sugar (glucose) after you have not eaten for a while (fasting). You may have this  done every 1-3 years. Bone density scan. This is done to screen for osteoporosis. You may have this done starting at age 61. Mammogram. This may be done every 1-2 years. Talk to your health care provider about how often you should have regular mammograms. Talk with your health care provider about your test results, treatment options, and if necessary, the need for more  tests. Vaccines  Your health care provider may recommend certain vaccines, such as: Influenza vaccine. This is recommended every year. Tetanus, diphtheria, and acellular pertussis (Tdap, Td) vaccine. You may need a Td booster every 10 years. Zoster vaccine. You may need this after age 50. Pneumococcal 13-valent conjugate (PCV13) vaccine. One dose is recommended after age 38. Pneumococcal polysaccharide (PPSV23) vaccine. One dose is recommended after age 91. Talk to your health care provider about which screenings and vaccines you need and how often you need them. This information is not intended to replace advice given to you by your health care provider. Make sure you discuss any questions you have with your health care provider. Document Released: 05/19/2015 Document Revised: 01/10/2016 Document Reviewed: 02/21/2015 Elsevier Interactive Patient Education  2017 [employer] Prevention in the Home Falls can cause injuries. They can happen to people of all ages. There are many things you can do to make your home safe and to help prevent falls. What can I do on the outside of my home? Regularly fix the edges of walkways and driveways and fix any cracks. Remove anything that might make you trip as you walk through a door, such as a raised step or threshold. Trim any bushes or trees on the path to your home. Use bright outdoor lighting. Clear any walking paths of anything that might make someone trip, such as rocks or tools. Regularly check to see if handrails are loose or broken. Make sure that both sides of any steps have handrails. Any raised decks and porches should have guardrails on the edges. Have any leaves, snow, or ice cleared regularly. Use sand or salt on walking paths during winter. Clean up any spills in your garage right away. This includes oil or grease spills. What can I do in the bathroom? Use night lights. Install grab bars by the toilet and in the tub and shower.  Do not use towel bars as grab bars. Use non-skid mats or decals in the tub or shower. If you need to sit down in the shower, use a plastic, non-slip stool. Keep the floor dry. Clean up any water that spills on the floor as soon as it happens. Remove soap buildup in the tub or shower regularly. Attach bath mats securely with double-sided non-slip rug tape. Do not have throw rugs and other things on the floor that can make you trip. What can I do in the bedroom? Use night lights. Make sure that you have a light by your bed that is easy to reach. Do not use any sheets or blankets that are too big for your bed. They should not hang down onto the floor. Have a firm chair that has side arms. You can use this for support while you get dressed. Do not have throw rugs and other things on the floor that can make you trip. What can I do in the kitchen? Clean up any spills right away. Avoid walking on wet floors. Keep items that you use a lot in easy-to-reach places. If you need to reach something above you, use a strong step stool that  has a grab bar. Keep electrical cords out of the way. Do not use floor polish or wax that makes floors slippery. If you must use wax, use non-skid floor wax. Do not have throw rugs and other things on the floor that can make you trip. What can I do with my stairs? Do not leave any items on the stairs. Make sure that there are handrails on both sides of the stairs and use them. Fix handrails that are broken or loose. Make sure that handrails are as long as the stairways. Check any carpeting to make sure that it is firmly attached to the stairs. Fix any carpet that is loose or worn. Avoid having throw rugs at the top or bottom of the stairs. If you do have throw rugs, attach them to the floor with carpet tape. Make sure that you have a light switch at the top of the stairs and the bottom of the stairs. If you do not have them, ask someone to add them for you. What else  can I do to help prevent falls? Wear shoes that: Do not have high heels. Have rubber bottoms. Are comfortable and fit you well. Are closed at the toe. Do not wear sandals. If you use a stepladder: Make sure that it is fully opened. Do not climb a closed stepladder. Make sure that both sides of the stepladder are locked into place. Ask someone to hold it for you, if possible. Clearly mark and make sure that you can see: Any grab bars or handrails. First and last steps. Where the edge of each step is. Use tools that help you move around (mobility aids) if they are needed. These include: Canes. Walkers. Scooters. Crutches. Turn on the lights when you go into a dark area. Replace any light bulbs as soon as they burn out. Set up your furniture so you have a clear path. Avoid moving your furniture around. If any of your floors are uneven, fix them. If there are any pets around you, be aware of where they are. Review your medicines with your doctor. Some medicines can make you feel dizzy. This can increase your chance of falling. Ask your doctor what other things that you can do to help prevent falls. This information is not intended to replace advice given to you by your health care provider. Make sure you discuss any questions you have with your health care provider. Document Released: 02/16/2009 Document Revised: 09/28/2015 Document Reviewed: 05/27/2014 Elsevier Interactive Patient Education  2017 [employer].

## 2022-01-24 NOTE — Progress Notes (Signed)
Subjective:   Samantha Burch is a 66 y.o. female who presents for Medicare Annual (Subsequent) preventive examination.  Review of Systems      Cardiac Risk Factors include: advanced age (>39mn, >>67women);obesity (BMI >30kg/m2)     Objective:    Today's Vitals   01/24/22 1411  BP: (!) 142/60  Pulse: 67  Temp: (!) 97.5 F (36.4 C)  TempSrc: Oral  SpO2: 99%  Weight: 197 lb (89.4 kg)  Height: 5' 1.5" (1.562 m)   Body mass index is 36.62 kg/m.     01/24/2022    2:25 PM 01/15/2021    2:45 PM 01/12/2021    6:13 PM 01/14/2020    3:50 PM  Advanced Directives  Does Patient Have a Medical Advance Directive? No No No No  Would patient like information on creating a medical advance directive? No - Patient declined       Current Medications (verified) Outpatient Encounter Medications as of 01/24/2022  Medication Sig   busPIRone (BUSPAR) 15 MG tablet Take 1 tablet (15 mg total) by mouth 2 (two) times daily.   Cobalamin Combinations (B-12) 1000-400 MCG SUBL Place under the tongue daily.   febuxostat (ULORIC) 40 MG tablet Take 40 mg by mouth daily.   fexofenadine (ALLEGRA) 180 MG tablet TAKE 1 TABLET BY MOUTH EVERY DAY   levothyroxine (SYNTHROID) 100 MCG tablet Take 1 tablet (100 mcg total) by mouth daily.   mirabegron ER (MYRBETRIQ) 50 MG TB24 tablet Take 1 tablet (50 mg total) by mouth daily.   omeprazole (PRILOSEC) 40 MG capsule Take 1 capsule (40 mg total) by mouth daily.   pregabalin (LYRICA) 225 MG capsule Take 1 capsule (225 mg total) by mouth 2 (two) times daily.   rosuvastatin (CRESTOR) 5 MG tablet TAKE 0.5 TABLETS (2.5 MG TOTAL) BY MOUTH 3 (THREE) TIMES A WEEK.   sertraline (ZOLOFT) 100 MG tablet TAKE 1.5 TABLETS BY MOUTH EVERY DAY   Vitamin D, Ergocalciferol, (DRISDOL) 1.25 MG (50000 UNIT) CAPS capsule Take 1 capsule (50,000 Units total) by mouth every 7 (seven) days.   No facility-administered encounter medications on file as of 01/24/2022.    Allergies  (verified) Elemental sulfur, Lipitor [atorvastatin], Pravastatin, and Sulfa antibiotics   History: Past Medical History:  Diagnosis Date   Anxiety    Depression    GERD (gastroesophageal reflux disease)    Gout    Hyperlipidemia    Hypoglycemia    Hypothyroidism    Neurogenic bladder    Neuropathy    Urinary incontinence    Past Surgical History:  Procedure Laterality Date   MULTIPLE TOOTH EXTRACTIONS  2003-2004   for dentures   Family History  Problem Relation Age of Onset   Ovarian cancer Mother    Alcohol abuse Father    Hypertension Father    Diabetes type II Father    Neuropathy Father    Neuropathy Paternal Grandmother    Colon cancer Maternal Uncle    Social History   Socioeconomic History   Marital status: Divorced    Spouse name: Not on file   Number of children: Not on file   Years of education: Not on file   Highest education level: Not on file  Occupational History   Occupation: Disabled  Tobacco Use   Smoking status: Former    Packs/day: 1.00    Years: 30.00    Total pack years: 30.00    Types: Cigarettes    Quit date: 05/07/2011    Years since  quitting: 10.7   Smokeless tobacco: Never  Vaping Use   Vaping Use: Never used  Substance and Sexual Activity   Alcohol use: No   Drug use: No   Sexual activity: Never  Other Topics Concern   Not on file  Social History Narrative   Right handed      Highest level of edu- 12th grade      Son lives with pt ( age 66)      Social Determinants of Health   Financial Resource Strain: Low Risk  (01/24/2022)   Overall Financial Resource Strain (CARDIA)    Difficulty of Paying Living Expenses: Not hard at all  Food Insecurity: No Food Insecurity (01/24/2022)   Hunger Vital Sign    Worried About Running Out of Food in the Last Year: Never true    Ran Out of Food in the Last Year: Never true  Transportation Needs: No Transportation Needs (01/24/2022)   PRAPARE - Hydrologist  (Medical): No    Lack of Transportation (Non-Medical): No  Physical Activity: Inactive (01/24/2022)   Exercise Vital Sign    Days of Exercise per Week: 0 days    Minutes of Exercise per Session: 0 min  Stress: No Stress Concern Present (01/24/2022)   [place]    Feeling of Stress : Not at all  Social Connections: Socially Isolated (01/24/2022)   Social Connection and Isolation Panel [NHANES]    Frequency of Communication with Friends and Family: More than three times a week    Frequency of Social Gatherings with Friends and Family: More than three times a week    Attends Religious Services: Never    Marine scientist or Organizations: No    Attends Music therapist: Never    Marital Status: Divorced    Tobacco Counseling Counseling given: Not Answered   Clinical Intake:  Pre-visit preparation completed: No  Pain : No/denies pain     BMI - recorded: 36.69 Nutritional Status: BMI > 30  Obese Nutritional Risks: None Diabetes: No  How often do you need to have someone help you when you read instructions, pamphlets, or other written materials from your doctor or pharmacy?: 1 - Never  Diabetic?  No  Interpreter Needed?: No  Information entered by :: Rolene Arbour LPN   Activities of Daily Living    01/24/2022    2:23 PM  In your present state of health, do you have any difficulty performing the following activities:  Hearing? 0  Vision? 0  Difficulty concentrating or making decisions? 0  Walking or climbing stairs? 0  Dressing or bathing? 0  Doing errands, shopping? 0  Preparing Food and eating ? N  Using the Toilet? N  In the past six months, have you accidently leaked urine? Y  Comment Followed by PCP  Do you have problems with loss of bowel control? N  Managing your Medications? N  Managing your Finances? N  Housekeeping or managing your Housekeeping? N    Patient Care  Team: Farrel Conners, MD as PCP - General (Family Medicine) Cameron Sprang, MD as Consulting Physician (Neurology)  Indicate any recent Medical Services you may have received from other than [facility] providers in the past year (date may be approximate).     Assessment:   This is a routine wellness examination for Samantha Burch.  Hearing/Vision screen Hearing Screening - Comments:: Denies hearing difficulties   Vision Screening -  Comments:: Wears rx glasses - up to date with routine eye exams with  Patient deferred  Dietary issues and exercise activities discussed: Current Exercise Habits: The patient does not participate in regular exercise at present, Exercise limited by: None identified   Goals Addressed               This Visit's Progress     Increase physical activity (pt-stated)         Depression Screen    01/24/2022    2:22 PM 01/24/2022    2:06 PM 09/07/2021    4:22 PM 02/16/2021   11:37 AM 11/15/2020    3:26 PM 08/04/2020   11:16 AM 01/05/2020   11:07 AM  PHQ 2/9 Scores  PHQ - 2 Score 0 '3 3 3 3 6 2  '$ PHQ- 9 Score 0 '15 8 10 13 10 6    '$ Fall Risk    01/24/2022    2:24 PM 01/24/2022    2:06 PM 01/15/2021    2:44 PM 08/04/2020   11:12 AM 01/14/2020    3:50 PM  Fall Risk   Falls in the past year? 0 0 0 1 1  Number falls in past yr: 0 0 0 1 0  Injury with Fall? 0 0 0 0 1  Risk for fall due to : No Fall Risks No Fall Risks   Impaired balance/gait  Follow up Falls prevention discussed Falls evaluation completed  Falls evaluation completed     [place]:  Any stairs in or around the home? No  If so, are there any without handrails? No  Home free of loose throw rugs in walkways, pet beds, electrical cords, etc? Yes  Adequate lighting in your home to reduce risk of falls? Yes   ASSISTIVE DEVICES UTILIZED TO PREVENT FALLS:  Life alert? No  Use of a cane, walker or w/c? No  Grab bars in the bathroom? No  Shower chair or bench in shower? No   Elevated toilet seat or a handicapped toilet? No   TIMED UP AND GO:  Was the test performed? Yes .  Length of time to ambulate 10 feet: 10 sec.   Gait steady and fast without use of assistive device  Cognitive Function:        01/24/2022    2:25 PM  6CIT Screen  What Year? 0 points  What month? 0 points  What time? 0 points  Count back from 20 0 points  Months in reverse 0 points  Repeat phrase 0 points  Total Score 0 points    Immunizations Immunization History  Administered Date(s) Administered   Fluad Quad(high Dose 65+) 01/24/2022   Influenza,inj,Quad PF,6+ Mos 01/13/2017, 03/11/2018, 03/11/2019   PFIZER(Purple Top)SARS-COV-2 Vaccination 07/23/2019, 08/18/2019, 08/17/2020   PNEUMOCOCCAL CONJUGATE-20 09/07/2021   Pfizer Covid-19 Vaccine Bivalent Booster 35yr & up 02/09/2021    TDAP status: Due, Education has been provided regarding the importance of this vaccine. Advised may receive this vaccine at local pharmacy or Health Dept. Aware to provide a copy of the vaccination record if obtained from local pharmacy or Health Dept. Verbalized acceptance and understanding.  Flu Vaccine status: Completed at today's visit  Pneumococcal vaccine status: Up to date  Covid-19 vaccine status: Completed vaccines  Qualifies for Shingles Vaccine? Yes   Zostavax completed No   Shingrix Completed?: No.    Education has been provided regarding the importance of this vaccine. Patient has been advised to call insurance company to  determine out of pocket expense if they have not yet received this vaccine. Advised may also receive vaccine at local pharmacy or Health Dept. Verbalized acceptance and understanding.  Screening Tests Health Maintenance  Topic Date Due   DEXA SCAN  Never done   COVID-19 Vaccine (5 - Pfizer risk series) 02/09/2022 (Originally 04/06/2021)   Zoster Vaccines- Shingrix (1 of 2) 04/25/2022 (Originally 06/11/1975)   COLONOSCOPY (Pts 45-52yr Insurance coverage will  need to be confirmed)  01/25/2023 (Originally 06/10/2001)   TETANUS/TDAP  01/25/2023 (Originally 05/06/2013)   Hepatitis C Screening  01/25/2023 (Originally 06/10/1974)   HIV Screening  01/25/2023 (Originally 06/11/1971)   MAMMOGRAM  04/13/2022   PAP SMEAR-Modifier  04/06/2023   Pneumonia Vaccine 65 Years old  Completed   INFLUENZA VACCINE  Completed   HPV VACCINES  Aged Out    Health Maintenance  Health Maintenance Due  Topic Date Due   DEXA SCAN  Never done    Colorectal cancer screening: Referral to GI placed Patient deferred. Pt aware the office will call re: appt.  Mammogram status: Completed 04/13/22. Repeat every year  Bone Density status: Ordered 01/24/22. Pt provided with contact info and advised to call to schedule appt.  Lung Cancer Screening: (Low Dose CT Chest recommended if Age 117-80years, 30 pack-year currently smoking OR have quit w/in 15years.) does not qualify.     Additional Screening:  Hepatitis C Screening: does qualify; Completed Deferred  Vision Screening: Recommended annual ophthalmology exams for early detection of glaucoma and other disorders of the eye. Is the patient up to date with their annual eye exam?  No  Who is the provider or what is the name of the office in which the patient attends annual eye exams? Patient deferred If pt is not established with a provider, would they like to be referred to a provider to establish care? No .   Dental Screening: Recommended annual dental exams for proper oral hygiene  Community Resource Referral / Chronic Care Management:  CRR required this visit?  No   CCM required this visit?  No      Plan:     I have personally reviewed and noted the following in the patient's chart:   Medical and social history Use of alcohol, tobacco or illicit drugs  Current medications and supplements including opioid prescriptions. Patient is not currently taking opioid prescriptions. Functional ability and  status Nutritional status Physical activity Advanced directives List of other physicians Hospitalizations, surgeries, and ER visits in previous 12 months Vitals Screenings to include cognitive, depression, and falls Referrals and appointments  In addition, I have reviewed and discussed with patient certain preventive protocols, quality metrics, and best practice recommendations. A written personalized care plan for preventive services as well as general preventive health recommendations were provided to patient.     BCriselda Peaches LPN   [id number]  Nurse Notes: Patient due labs HIV Screening and Hep-C Screening

## 2022-01-24 NOTE — Progress Notes (Signed)
Established Patient Office Visit  Subjective   Patient ID: Samantha Burch, female    DOB: 30-May-1955  Age: 66 y.o. MRN: [id number]  Chief Complaint  Patient presents with   Transitions Of Care    Patient is here for transition of care visit.  Depression - PHQ was reviewed, scoring still fairly elevated. She reports that she states her symptoms come and go, states that she has been on these current medications provide her some relief. She states that she does not want to change her medications at this time. She denies SI/HI, states that her symptoms are manageable at this time.   Urinary incontinence -- pt reports she was switched to myrbetric from her detrol -- states that she thought the detrol was losing effectiveness over time. States that the 50 mg dose of myrbetric was working well, but she noticed swelling in her feet and thought it was the medication that was causing it. We discussed that the detrol cannot be increased and this was why the medication was switched.   I reviewed her current problem list. Patient states she needs refills on most of her medication today.   Prediabetes-- pt was referred to a prediabetes course and she did complete this, states that she is not doing well as she would like with her diet and exercise. A1C in the office today is 5.8 which is better than previous.    Current Outpatient Medications  Medication Instructions   busPIRone (BUSPAR) 15 mg, Oral, 2 times daily   Cobalamin Combinations (B-12) 1000-400 MCG SUBL Sublingual, Daily   febuxostat (ULORIC) 40 mg, Oral, Daily   fexofenadine (ALLEGRA) 180 MG tablet TAKE 1 TABLET BY MOUTH EVERY DAY   levothyroxine (SYNTHROID) 100 mcg, Oral, Daily   mirabegron ER (MYRBETRIQ) 50 mg, Oral, Daily   omeprazole (PRILOSEC) 40 mg, Oral, Daily   pregabalin (LYRICA) 225 mg, Oral, 2 times daily   rosuvastatin (CRESTOR) 5 MG tablet TAKE 0.5 TABLETS (2.5 MG TOTAL) BY MOUTH 3 (THREE) TIMES A WEEK.   sertraline  (ZOLOFT) 100 MG tablet TAKE 1.5 TABLETS BY MOUTH EVERY DAY   Vitamin D (Ergocalciferol) (DRISDOL) 50,000 Units, Oral, Every 7 days    Patient Active Problem List   Diagnosis Date Noted   Elevated blood pressure reading 01/25/2022   Prediabetes 01/24/2022   Vitamin D deficiency 04/26/2020   Hyperglycemia 04/26/2020   Hyperlipidemia 04/26/2020   B12 deficiency 01/05/2020   Overactive bladder 01/05/2020   Fatty liver 01/05/2020   Gout    Hypothyroidism    GERD (gastroesophageal reflux disease)    Idiopathic peripheral neuropathy 09/19/2016   Depression, recurrent ([place]) 09/19/2016   Anxiety 09/19/2016   Neurogenic bladder 09/19/2016      Review of Systems  All other systems reviewed and are negative.     Objective:     BP (!) 142/60 (BP Location: Left Arm, Patient Position: Sitting, Cuff Size: Normal)   Pulse 67   Temp (!) 97.5 F (36.4 C) (Oral)   Ht 5' 1.5" (1.562 m)   Wt 197 lb 6.4 oz (89.5 kg)   LMP 05/06/2006 (Approximate)   SpO2 99%   BMI 36.69 kg/m  BP Readings from Last 3 Encounters:  01/24/22 (!) 142/60  01/24/22 (!) 142/60  09/07/21 122/82   Wt Readings from Last 3 Encounters:  01/24/22 197 lb (89.4 kg)  01/24/22 197 lb 6.4 oz (89.5 kg)  09/07/21 198 lb 9.6 oz (90.1 kg)      Physical Exam Vitals reviewed.  Constitutional:      Appearance: Normal appearance. She is well-groomed. She is obese.  Eyes:     Extraocular Movements: Extraocular movements intact.     Conjunctiva/sclera: Conjunctivae normal.  Neck:     Thyroid: No thyromegaly.  Cardiovascular:     Rate and Rhythm: Normal rate and regular rhythm.     Pulses: Normal pulses.     Heart sounds: S1 normal and S2 normal.  Pulmonary:     Effort: Pulmonary effort is normal.     Breath sounds: Normal breath sounds and air entry.  Abdominal:     General: Bowel sounds are normal.  Musculoskeletal:        General: Normal range of motion.     Right lower leg: No edema.     Left lower leg: No  edema.  Skin:    General: Skin is warm and dry.  Neurological:     Mental Status: She is alert and oriented to person, place, and time. Mental status is at baseline.     Gait: Gait is intact.  Psychiatric:        Mood and Affect: Mood and affect normal.        Speech: Speech normal.        Behavior: Behavior normal.        Judgment: Judgment normal.      Results for orders placed or performed in visit on 01/24/22  POC HgB A1c  Result Value Ref Range   Hemoglobin A1C 5.8 (A) 4.0 - 5.6 %   HbA1c POC (<> result, manual entry)     HbA1c, POC (prediabetic range)     HbA1c, POC (controlled diabetic range)      Last hemoglobin A1c Lab Results  Component Value Date   HGBA1C 5.8 (A) 01/24/2022      The 10-year ASCVD risk score (Arnett DK, et al., 2019) is: 7.1%    Assessment & Plan:   Problem List Items Addressed This Visit       Digestive   GERD (gastroesophageal reflux disease)    SX well controlled per the patient's report, she would like to conitnue her 40 mg prilosec daily. Refills written.      Relevant Medications   omeprazole (PRILOSEC) 40 MG capsule     Endocrine   Hypothyroidism    Current dose 100 mcg daily of levothyroxine, reviewed last TSH which was WNL, will continue current dose.      Relevant Medications   levothyroxine (SYNTHROID) 100 MCG tablet     Genitourinary   Overactive bladder    Detrol was ineffective, I recommended continuing the 50 mg myrbetric daily since that was working for her. Refills given.      Relevant Medications   mirabegron ER (MYRBETRIQ) 50 MG TB24 tablet     Other   Depression, recurrent (HCC) - Primary    Recurrent with anxiety also. Her symptoms are stable per her report, will continue her sertraline 100 mg daily and her buspirone 15 mg twice daily.       Relevant Medications   busPIRone (BUSPAR) 15 MG tablet   sertraline (ZOLOFT) 100 MG tablet   Anxiety   Relevant Medications   busPIRone (BUSPAR) 15 MG tablet    sertraline (ZOLOFT) 100 MG tablet   Vitamin D deficiency    Level needs to be rechecked, she continues on her 50k unit capsules once weekly. Will recheck in 6 months at her next visit.       Relevant Medications  Vitamin D, Ergocalciferol, (DRISDOL) 1.25 MG (50000 UNIT) CAPS capsule   Hyperlipidemia   Relevant Medications   rosuvastatin (CRESTOR) 5 MG tablet   Elevated blood pressure reading    Pt reports no previous history of blood pressure problems, I advised that she check her BP at home daily and send me the results so that I can review. I gave her the goal of less than 140/90. If she is consistently above this she is to call me so we can discuss medication.      Prediabetes (Chronic)    A1C is 5.8, well controlled with dietary measures, will continue to check A1C every 6 months      Relevant Orders   POC HgB A1c (Completed)   Other Visit Diagnoses     Depression, unspecified depression type       Relevant Medications   busPIRone (BUSPAR) 15 MG tablet   sertraline (ZOLOFT) 100 MG tablet   Immunization due       Relevant Orders   Flu Vaccine QUAD High Dose(Fluad) (Completed)   Postmenopausal state       Relevant Orders   DG Bone Density   Colon cancer screening       Relevant Orders   Cologuard   Need for influenza vaccination           Return in about 6 months (around 07/25/2022) for follow up prediabetes.    Farrel Conners, MD

## 2022-01-25 DIAGNOSIS — R030 Elevated blood-pressure reading, without diagnosis of hypertension: Secondary | ICD-10-CM | POA: Insufficient documentation

## 2022-01-25 NOTE — Assessment & Plan Note (Signed)
Detrol was ineffective, I recommended continuing the 50 mg myrbetric daily since that was working for her. Refills given.

## 2022-01-25 NOTE — Assessment & Plan Note (Signed)
Recurrent with anxiety also. Her symptoms are stable per her report, will continue her sertraline 100 mg daily and her buspirone 15 mg twice daily.

## 2022-01-25 NOTE — Assessment & Plan Note (Signed)
A1C is 5.8, well controlled with dietary measures, will continue to check A1C every 6 months

## 2022-01-25 NOTE — Assessment & Plan Note (Signed)
SX well controlled per the patient's report, she would like to conitnue her 40 mg prilosec daily. Refills written.

## 2022-01-25 NOTE — Assessment & Plan Note (Signed)
Current dose 100 mcg daily of levothyroxine, reviewed last TSH which was WNL, will continue current dose.

## 2022-01-25 NOTE — Assessment & Plan Note (Addendum)
Level needs to be rechecked, she continues on her 50k unit capsules once weekly. Will recheck in 6 months at her next visit.

## 2022-01-25 NOTE — Assessment & Plan Note (Signed)
Pt reports no previous history of blood pressure problems, I advised that she check her BP at home daily and send me the results so that I can review. I gave her the goal of less than 140/90. If she is consistently above this she is to call me so we can discuss medication.

## 2022-01-28 NOTE — Telephone Encounter (Signed)
Please call patient and find out if she is agreeable to switching to pravastatin 40 mg or lovastatin 40 mg daily

## 2022-02-01 ENCOUNTER — Other Ambulatory Visit: Payer: Self-pay | Admitting: *Deleted

## 2022-02-01 DIAGNOSIS — J3089 Other allergic rhinitis: Secondary | ICD-10-CM

## 2022-02-03 MED ORDER — FEXOFENADINE HCL 180 MG PO TABS: 180.0000 mg | ORAL_TABLET | Freq: Every day | ORAL | 1 refills | Status: DC

## 2022-02-06 ENCOUNTER — Ambulatory Visit (INDEPENDENT_AMBULATORY_CARE_PROVIDER_SITE_OTHER): Payer: Medicare Other | Admitting: Neurology

## 2022-02-06 ENCOUNTER — Encounter: Payer: Self-pay | Admitting: Neurology

## 2022-02-06 ENCOUNTER — Telehealth: Payer: Self-pay | Admitting: Pharmacy Technician

## 2022-02-06 VITALS — BP 160/82 | HR 65 | Ht 61.5 in | Wt 197.0 lb

## 2022-02-06 DIAGNOSIS — M545 Low back pain, unspecified: Secondary | ICD-10-CM | POA: Diagnosis not present

## 2022-02-06 DIAGNOSIS — G609 Hereditary and idiopathic neuropathy, unspecified: Secondary | ICD-10-CM

## 2022-02-06 DIAGNOSIS — G8929 Other chronic pain: Secondary | ICD-10-CM | POA: Diagnosis not present

## 2022-02-06 MED ORDER — PREGABALIN 225 MG PO CAPS: 225.0000 mg | ORAL_CAPSULE | Freq: Two times a day (BID) | ORAL | 3 refills | Status: DC

## 2022-02-06 NOTE — Progress Notes (Signed)
NEUROLOGY FOLLOW UP OFFICE NOTE  Samantha Burch [id number] 11/24/1955  HISTORY OF PRESENT ILLNESS: I had the pleasure of seeing Samantha Burch in follow-up in the neurology clinic on 02/06/2022.  The patient was last seen a year ago for peripheral polyneuropathy. She is alone in the office today. Since her last visit, symptoms remain stable on Lyrica '225mg'$  BID, no side effects. She still has the same tingling, burning, and numbness affecting her feet/calves with good response to Lyrica. She denies any weakness, no falls. No headaches, dizziness, vision changes. Sleep is good. Her back still bothers her some, we had previously discussed physical therapy but she has been nervous to get out and drive. She lives with her son.     I had the pleasure of seeing Samantha Burch in follow-up in the neurology clinic on 01/15/2021.  The patient was last seen a year ago for peripheral polyneuropathy. She is alone in the office today. She has been on Lyrica '225mg'$  BID for neuropathic pain without side effects. She states symptoms are about the same with tingling, numbness, burning that have good response to Lyrica. Symptoms are not keeping her up at night. She has been told she is prediabetic and has started educating herself about it. She denies any headaches, dizziness, vision changes, bowel/bladder dysfunction. No falls. Sleep overall okay. Her back is still bothering her, no neck pain.    History on Initial Assessment 09/19/2016: This is a very pleasant 66 year old right-handed woman with a history of hypothyroidism, neurogenic bladder, depression, anxiety, presenting to establish care for peripheral polyneuropathy. She recently moved to [place] from [place] with her son. She had been seeing neurologist Dr. [name] for the neuropathy. 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 Symptoms worsened over time with significant pain in  both legs described as pain inside her legs like she wanted to scratch them all the time. She described pins and needles sensation, sometimes affecting her arms as well. She was initially started on gabapentin but it stopped working, and has been taking Lyrica for the past 10 years. She is taking Lyrica '225mg'$  BID without side effects. She reports symptom control is much better but not perfect, there are days the neuropathy bothers her, more when she is inactive. She has found that moving around helps relieve the symptoms. She fell last year and broke her left foot, and since then has tended to veer when she gets up. She denies any back pain, but reports pain in the right hip region radiating down the back of her right leg. She tells me her neurologist told her she has unusually brisk reflexes for the neuropathy, per records she has had an MRI brain, cervical, thoracic spine that were unremarkable. NCS reported as normal. Her B12 level was low and she has been on monthly B12 injections. She has a history of vertigo that significantly improved with vestibular therapy, she was told she has gaze instability, and has intermittent vertigo with positional changes. She denies any headaches, tinnitus, diplopia, dysarthria/dysphagia, neck pain, bowel/bladder dysfunction. She has a history of neurogenic bladder on Detrol, low potassium on daily potassium supplements, and depression/anxiety on Zoloft. She reports a family history of peripheral polyneuropathy in her father and paternal grandfather.   PAST MEDICAL HISTORY: Past Medical History:  Diagnosis Date   Anxiety    Depression    GERD (gastroesophageal reflux disease)    Gout  Hyperlipidemia    Hypoglycemia    Hypothyroidism    Neurogenic bladder    Neuropathy    Urinary incontinence     MEDICATIONS: Current Outpatient Medications on File Prior to Visit  Medication Sig Dispense Refill   busPIRone (BUSPAR) 15 MG tablet Take 1 tablet (15 mg total) by  mouth 2 (two) times daily. 180 tablet 3   Cobalamin Combinations (B-12) 1000-400 MCG SUBL Place under the tongue daily.     febuxostat (ULORIC) 40 MG tablet Take 40 mg by mouth daily.     fexofenadine (ALLEGRA) 180 MG tablet Take 1 tablet (180 mg total) by mouth daily. 90 tablet 1   levothyroxine (SYNTHROID) 100 MCG tablet Take 1 tablet (100 mcg total) by mouth daily. 90 tablet 1   mirabegron ER (MYRBETRIQ) 50 MG TB24 tablet Take 1 tablet (50 mg total) by mouth daily. 90 tablet 1   omeprazole (PRILOSEC) 40 MG capsule Take 1 capsule (40 mg total) by mouth daily. 90 capsule 1   pregabalin (LYRICA) 225 MG capsule Take 1 capsule (225 mg total) by mouth 2 (two) times daily. 180 capsule 3   rosuvastatin (CRESTOR) 5 MG tablet Take by mouth.     sertraline (ZOLOFT) 100 MG tablet TAKE 1.5 TABLETS BY MOUTH EVERY DAY 135 tablet 1   Vitamin D, Ergocalciferol, (DRISDOL) 1.25 MG (50000 UNIT) CAPS capsule Take 1 capsule (50,000 Units total) by mouth every 7 (seven) days. 12 capsule 1   No current facility-administered medications on file prior to visit.    ALLERGIES: Allergies  Allergen Reactions   Elemental Sulfur    Lipitor [Atorvastatin]     Cramping muscles   Pravastatin     cramping   Sulfa Antibiotics     FAMILY HISTORY: Family History  Problem Relation Age of Onset   Ovarian cancer Mother    Alcohol abuse Father    Hypertension Father    Diabetes type II Father    Neuropathy Father    Neuropathy Paternal Grandmother    Colon cancer Maternal Uncle     SOCIAL HISTORY: Social History   Socioeconomic History   Marital status: Divorced    Spouse name: Not on file   Number of children: Not on file   Years of education: Not on file   Highest education level: Not on file  Occupational History   Occupation: Disabled  Tobacco Use   Smoking status: Former    Packs/day: 1.00    Years: 30.00    Total pack years: 30.00    Types: Cigarettes    Quit date: 05/07/2011    Years since  quitting: 10.7   Smokeless tobacco: Never  Vaping Use   Vaping Use: Never used  Substance and Sexual Activity   Alcohol use: No   Drug use: No   Sexual activity: Never  Other Topics Concern   Not on file  Social History Narrative   Right handed      Highest level of edu- 12th grade      Son lives with pt ( age 51)      Social Determinants of Health   Financial Resource Strain: Low Risk  (01/24/2022)   Overall Financial Resource Strain (CARDIA)    Difficulty of Paying Living Expenses: Not hard at all  Food Insecurity: No Food Insecurity (01/24/2022)   Hunger Vital Sign    Worried About Running Out of Food in the Last Year: Never true    Ran Out of Food in the Last Year:  Never true  Transportation Needs: No Transportation Needs (01/24/2022)   PRAPARE - Hydrologist (Medical): No    Lack of Transportation (Non-Medical): No  Physical Activity: Inactive (01/24/2022)   Exercise Vital Sign    Days of Exercise per Week: 0 days    Minutes of Exercise per Session: 0 min  Stress: No Stress Concern Present (01/24/2022)   [place]    Feeling of Stress : Not at all  Social Connections: Socially Isolated (01/24/2022)   Social Connection and Isolation Panel [NHANES]    Frequency of Communication with Friends and Family: More than three times a week    Frequency of Social Gatherings with Friends and Family: More than three times a week    Attends Religious Services: Never    Marine scientist or Organizations: No    Attends Archivist Meetings: Never    Marital Status: Divorced  Human resources officer Violence: Not At Risk (01/24/2022)   Humiliation, Afraid, Rape, and Kick questionnaire    Fear of Current or Ex-Partner: No    Emotionally Abused: No    Physically Abused: No    Sexually Abused: No     PHYSICAL EXAM: Vitals:   02/06/22 1118  BP: (!) 160/82  Pulse: 65  SpO2: 97%    General: No acute distress Head:  Normocephalic/atraumatic Skin/Extremities: No rash, no edema Neurological Exam: alert and awake. No aphasia or dysarthria. Fund of knowledge is appropriate. Attention and concentration are normal.   Cranial nerves: Pupils equal, round. Extraocular movements intact with no nystagmus. Visual fields full.  No facial asymmetry.  Motor: Bulk and tone normal, muscle strength 5/5 throughout with no pronator drift. Sensation intact to all modalities on both UE. Decreased vibration sense to knees bilaterally. Intact temperature. Reflexes brisk +3 throughout with +Hoffman on left. No clonus.   Finger to nose testing intact.  Gait narrow-based and steady, no ataxia.  Romberg negative.   IMPRESSION: This is a very pleasant 66 yo RH woman with a history of hypothyroidism, neurogenic bladder, depression, anxiety, with peripheral polyneuropathy. Neuropathy remains stable with good response to Lyrica '225mg'$  BID, refills sent. No weakness, reflexes are again brisk, no neck pain. She continues to report back pain and agrees to Physical therapy referral for pain and balance. Follow-up in 1 year, call for any changes.    Thank you for allowing me to participate in her care.  Please do not hesitate to call for any questions or concerns.    Ellouise Newer, M.D.   CC: Dr. Legrand Como

## 2022-02-06 NOTE — Telephone Encounter (Signed)
Patient Advocate Encounter  Prior Authorization for Pregabalin '225MG'$  capsules has been approved.     Effective: 05-06-2021 to 02-06-2023

## 2022-02-06 NOTE — Telephone Encounter (Signed)
Submitted a Prior Authorization request to [facility] for  Pregabalin '225MG'$    via CoverMyMeds. Will update once we receive a response.   Key: [id number] - PA Case ID: [id number]

## 2022-02-06 NOTE — Patient Instructions (Signed)
Always good to see you.  Continue Lyrica '225mg'$  twice a day  2. Referral will be sent for Physical therapy to work on balance  3. Start monitoring your blood pressure  4. Follow-up in 1 year, call for any changes

## 2022-02-07 NOTE — Addendum Note (Signed)
Addended by: Jake Seats on: 02/07/2022 08:07 AM   Modules accepted: Orders

## 2022-02-11 DIAGNOSIS — Z1211 Encounter for screening for malignant neoplasm of colon: Secondary | ICD-10-CM | POA: Diagnosis not present

## 2022-02-17 LAB — COLOGUARD: COLOGUARD: NEGATIVE

## 2022-02-18 NOTE — Progress Notes (Signed)
Cologuard normal, repeat in 3 years

## 2022-03-14 ENCOUNTER — Telehealth: Payer: Self-pay | Admitting: *Deleted

## 2022-03-14 NOTE — Telephone Encounter (Signed)
[facility]ed a refill request for new Rx for Rosuvastatin '5mg'$ -stating the patient takes 1/2 tablet daily.  Message sent to PCP.

## 2022-03-15 ENCOUNTER — Other Ambulatory Visit: Payer: Self-pay | Admitting: Family Medicine

## 2022-03-15 DIAGNOSIS — E782 Mixed hyperlipidemia: Secondary | ICD-10-CM

## 2022-03-15 MED ORDER — ROSUVASTATIN CALCIUM 5 MG PO TABS: 2.5000 mg | ORAL_TABLET | Freq: Every day | ORAL | 3 refills | Status: DC

## 2022-03-15 NOTE — Telephone Encounter (Signed)
Noted  

## 2022-03-15 NOTE — Telephone Encounter (Signed)
Rx sent 

## 2022-03-25 ENCOUNTER — Ambulatory Visit: Payer: Medicare Other | Attending: Neurology | Admitting: Physical Therapy

## 2022-03-25 ENCOUNTER — Encounter: Payer: Self-pay | Admitting: Physical Therapy

## 2022-03-25 ENCOUNTER — Other Ambulatory Visit: Payer: Self-pay

## 2022-03-25 DIAGNOSIS — R2681 Unsteadiness on feet: Secondary | ICD-10-CM | POA: Insufficient documentation

## 2022-03-25 DIAGNOSIS — M5459 Other low back pain: Secondary | ICD-10-CM | POA: Diagnosis not present

## 2022-03-25 DIAGNOSIS — M6281 Muscle weakness (generalized): Secondary | ICD-10-CM | POA: Insufficient documentation

## 2022-03-25 DIAGNOSIS — G609 Hereditary and idiopathic neuropathy, unspecified: Secondary | ICD-10-CM | POA: Insufficient documentation

## 2022-03-25 DIAGNOSIS — R2689 Other abnormalities of gait and mobility: Secondary | ICD-10-CM | POA: Diagnosis not present

## 2022-03-25 NOTE — Therapy (Signed)
OUTPATIENT PHYSICAL THERAPY NEURO EVALUATION   Patient Name: Samantha Burch MRN: [id number] DOB:01/28/1956, 66 y.o., female Today's Date: 03/25/2022   PCP: Farrel Conners, MD  REFERRING PROVIDER:   Cameron Sprang, MD    END OF SESSION:  PT End of Session - 03/25/22 1534     Visit Number 1    Number of Visits 8    Date for PT Re-Evaluation 05/17/22    Authorization Type Medicare/[employer]    PT Start Time 1530    PT Stop Time 1615    PT Time Calculation (min) 45 min    Activity Tolerance Patient tolerated treatment well;No increased pain    Behavior During Therapy WFL for tasks assessed/performed             Past Medical History:  Diagnosis Date   Anxiety    Depression    GERD (gastroesophageal reflux disease)    Gout    Hyperlipidemia    Hypoglycemia    Hypothyroidism    Neurogenic bladder    Neuropathy    Urinary incontinence    Past Surgical History:  Procedure Laterality Date   MULTIPLE TOOTH EXTRACTIONS  2003-2004   for dentures   Patient Active Problem List   Diagnosis Date Noted   Elevated blood pressure reading 01/25/2022   Prediabetes 01/24/2022   Vitamin D deficiency 04/26/2020   Hyperglycemia 04/26/2020   Hyperlipidemia 04/26/2020   B12 deficiency 01/05/2020   Overactive bladder 01/05/2020   Fatty liver 01/05/2020   Gout    Hypothyroidism    GERD (gastroesophageal reflux disease)    Idiopathic peripheral neuropathy 09/19/2016   Depression, recurrent ([place]) 09/19/2016   Anxiety 09/19/2016   Neurogenic bladder 09/19/2016    ONSET DATE: 02/06/2022 (MD referral)  REFERRING DIAG: G60.9 (ICD-10-CM) - Idiopathic peripheral neuropathy   THERAPY DIAG:  Unsteadiness on feet  Other abnormalities of gait and mobility  Other low back pain  Muscle weakness (generalized)  Rationale for Evaluation and Treatment: Rehabilitation  SUBJECTIVE:                                                                                                                                                                                              SUBJECTIVE STATEMENT: Have some low back pain, have significant arthritis in low back and sometimes have pain in hip and leg.  Have had x-ray several years ago.  Dr. Delice Lesch has encouraged me to come to therapy.  Have had peripheral neuropathy and balance is an issue.  Have hx of vertigo. Pt accompanied by: self  PERTINENT HISTORY: PMH includes anxiety, depression, GERD, gout,  HLD, neurogenic bladder, urinary incontinence; hx of fall approx 5 years ago.  PAIN:  Are you having pain? Yes: NPRS scale: 7/10 Pain location: back, sometimes burning into R posterior thigh Pain description: achy Aggravating factors: overhead reaching Relieving factors: medication  PRECAUTIONS: Fall  WEIGHT BEARING RESTRICTIONS: No  FALLS: Has patient fallen in last 6 months? No  LIVING ENVIRONMENT: Lives with: lives with their son Lives in: House/apartment Stairs: No Has following equipment at home: None  PLOF: Independent  Reports being sedentary.   PATIENT GOALS: "To improve the hip and back pain."  OBJECTIVE:   DIAGNOSTIC FINDINGS: NA  COGNITION: Overall cognitive status: Within functional limits for tasks assessed   SENSATION: Light touch: WFL  POSTURE: rounded shoulders, forward head, and posterior pelvic tilt  Repeated motions Lumbar flexion:  pain unchanged; pain into bilateral posterior (tightness) Lumbar extension:  pain slightly improved  Hamstring ROM:  Passively from supine 90/90:  -21 degrees from full extension on R; -24 degrees from full extension on L SLR test:  tightness in low back noted at 70 degrees L, 64 degrees on R LOWER EXTREMITY ROM:   WFL  Active  Right Eval Left Eval  Hip flexion    Hip extension    Hip abduction    Hip adduction    Hip internal rotation    Hip external rotation    Knee flexion    Knee extension    Ankle dorsiflexion    Ankle  plantarflexion    Ankle inversion    Ankle eversion     (Blank rows = not tested)  LOWER EXTREMITY MMT:    MMT Right Eval Left Eval  Hip flexion 3+ 4  Hip extension    Hip abduction    Hip adduction    Hip internal rotation    Hip external rotation    Knee flexion 4 4  Knee extension 4 4  Ankle dorsiflexion 3+ 3+  Ankle plantarflexion    Ankle inversion    Ankle eversion    (Blank rows = not tested)   TRANSFERS: Assistive device utilized: None  Sit to stand: Modified independence Stand to sit: Modified independence   GAIT: Gait pattern:  wide BOS and step through pattern Distance walked: 40 ft x 2 Assistive device utilized: None Level of assistance: Modified independence Comments: NA  FUNCTIONAL TESTS:  5 times sit to stand: 15 sec   M-CTSIB  Condition 1: Firm Surface, EO 30 Sec, Normal Sway  Condition 2: Firm Surface, EC 30 Sec, Mild Sway  Condition 3: Foam Surface, EO 30 Sec, Normal Sway  Condition 4: Foam Surface, EC 20.25 Sec, Severe Sway    PATIENT SURVEYS:  NA  TODAY'S TREATMENT:                                                                                                                              DATE: 03/25/2022    PATIENT EDUCATION: Education details: PT eval  results, POC, HEP initiated Person educated: Patient Education method: Explanation, Demonstration, and Handouts Education comprehension: verbalized understanding, returned demonstration, and needs further education  HOME EXERCISE PROGRAM: Access Code: [email or web address] URL: [email or web address] Date: 03/25/2022 Prepared by: [facility]  Exercises - Supine Pelvic Tilt  - 1-2 x daily - 7 x weekly - 1 sets - 10 reps - 3 sec hold - Hooklying Single Knee to Chest Stretch  - 1-2 x daily - 7 x weekly - 1 sets - 3-5 reps - 15-30 sec hold - Supine Lower Trunk Rotation  - 1-2 x daily - 7 x weekly - 1 sets - 5 reps - 15-30 sec hold  GOALS: Goals  reviewed with patient? Yes  SHORT TERM GOALS: Target date: 04/19/2022  Pt will be independent with HEP for improved strength, balance, decreased pain. Baseline: Goal status: INITIAL  2.  Pt will improve 5x sit<>stand to less than or equal to 12 sec to demonstrate improved functional strength and transfer efficiency. Baseline: 15 sec Goal status: INITIAL  LONG TERM GOALS: Target date: 05/17/2022  Pt will be independent with HEP for improved transfers, balance, gait, decreased pain.. Baseline:  Goal status: INITIAL  2.  Pt will report 2/10 or less pain in back with functional daily activities, for improved activity tolerance and participation. Baseline: 5/10 Goal status: INITIAL  3.  Pt will perform 30 seconds on Condition 4 of MCTSIB with moderate or better sway, for improved balance. Baseline: 20 sec severe sway. Goal status: INITIAL  4.  Further gait and balance testing to be assessed as appropriate. Baseline:  Goal status: INITIAL  ASSESSMENT:  CLINICAL IMPRESSION: Patient is a 66 y.o. female who was seen today for physical therapy evaluation and treatment for peripheral neuropathy and back pain.   She presents to OPPT with back pain, abnormal posture, decreased strength, decreased balance, decreased vestibular system use for balance, hx of vertigo, abnormal gait.  She has had one fall in the past (>5 years ago), but is at increased fall risk per FTSTS test.  She will benefit from skilled PT to address the above stated deficits to decrease fall risk and improve overall functional mobility.  OBJECTIVE IMPAIRMENTS: Abnormal gait, decreased balance, decreased mobility, difficulty walking, decreased ROM, decreased strength, dizziness, impaired flexibility, and postural dysfunction.   ACTIVITY LIMITATIONS: bending, sitting, standing, sleeping, transfers, bed mobility, and locomotion level  PARTICIPATION LIMITATIONS: meal prep, cleaning, laundry, and community activity  PERSONAL  FACTORS: 3+ comorbidities: See above  are also affecting patient's functional outcome.   REHAB POTENTIAL: Good  CLINICAL DECISION MAKING: Evolving/moderate complexity  EVALUATION COMPLEXITY: Moderate  PLAN:  PT FREQUENCY: 1x/week  PT DURATION: 8 weeks, including eval week  PLANNED INTERVENTIONS: Therapeutic exercises, Therapeutic activity, Neuromuscular re-education, Balance training, Gait training, Patient/Family education, Self Care, Joint mobilization, Vestibular training, Canalith repositioning, Electrical stimulation, Moist heat, and Manual therapy  PLAN FOR NEXT SESSION: Review initial HEP; progress exercises for lumbar and hamstring flexibility, lumbar and lower extremity strength, balance; further balance/gait testing as needed; with hx of vertigo, may need to assess for vertigo if she reports it has returned.   Amal Renbarger W., PT 03/25/2022, 5:30 PM

## 2022-04-02 NOTE — Therapy (Addendum)
OUTPATIENT PHYSICAL THERAPY NEURO TREATMENT   Patient Name: Samantha Burch MRN: [id number] DOB:May 22, 1955, 66 y.o., female Today's Date: 04/03/2022   PCP: Farrel Conners, MD  REFERRING PROVIDER:   Cameron Sprang, MD    Progress Note Reporting Period 03/25/22 to 05/22/21  See note below for Objective Data and Assessment of Progress/Goals.      END OF SESSION:  PT End of Session - 04/03/22 1443     Visit Number 2    Number of Visits 8    Date for PT Re-Evaluation 05/17/22    Authorization Type Medicare/[employer]    PT Start Time 1405    PT Stop Time 1443    PT Time Calculation (min) 38 min    Equipment Utilized During Treatment Gait belt    Activity Tolerance Patient tolerated treatment well    Behavior During Therapy WFL for tasks assessed/performed              Past Medical History:  Diagnosis Date   Anxiety    Depression    GERD (gastroesophageal reflux disease)    Gout    Hyperlipidemia    Hypoglycemia    Hypothyroidism    Neurogenic bladder    Neuropathy    Urinary incontinence    Past Surgical History:  Procedure Laterality Date   MULTIPLE TOOTH EXTRACTIONS  2003-2004   for dentures   Patient Active Problem List   Diagnosis Date Noted   Elevated blood pressure reading 01/25/2022   Prediabetes 01/24/2022   Vitamin D deficiency 04/26/2020   Hyperglycemia 04/26/2020   Hyperlipidemia 04/26/2020   B12 deficiency 01/05/2020   Overactive bladder 01/05/2020   Fatty liver 01/05/2020   Gout    Hypothyroidism    GERD (gastroesophageal reflux disease)    Idiopathic peripheral neuropathy 09/19/2016   Depression, recurrent ([place]) 09/19/2016   Anxiety 09/19/2016   Neurogenic bladder 09/19/2016    ONSET DATE: 02/06/2022 (MD referral)  REFERRING DIAG: G60.9 (ICD-10-CM) - Idiopathic peripheral neuropathy   THERAPY DIAG:  Unsteadiness on feet  Other abnormalities of gait and mobility  Other low back pain  Muscle weakness  (generalized)  Rationale for Evaluation and Treatment: Rehabilitation  SUBJECTIVE:                                                                                                                                                                                             SUBJECTIVE STATEMENT: Has been working on HEP- no questions or concerns.  Pt accompanied by: self  PERTINENT HISTORY: PMH includes anxiety, depression, GERD, gout, HLD, neurogenic bladder, urinary incontinence; hx of fall approx 5  years ago.  PAIN:  Are you having pain? No  PRECAUTIONS: Fall  WEIGHT BEARING RESTRICTIONS: No  FALLS: Has patient fallen in last 6 months? No  LIVING ENVIRONMENT: Lives with: lives with their son Lives in: House/apartment Stairs: No Has following equipment at home: None  PLOF: Independent  Reports being sedentary.   PATIENT GOALS: "To improve the hip and back pain."  OBJECTIVE:      TODAY'S TREATMENT: 04/03/22 Activity Comments  review HEP:  supine pelvic tilt 20x hooklying SKTC 30" each LTR 10x each  Difficulty coordinating pelvic tilts even with manual/verbal cueing   Sitting pelvic tilts  10x  Better understanding of this exercise   Bridge 10x  Limited ROM  clam red TB x15 Good control   open book stretch 10x each side  Good tolerance cues; to avoid excessive shoulder stretch  alt forward/backward step 5x with handrail, 10x without each LE Hip drop and trunk lean on R>L LE; CGA-min A  heel/toe raise 15x  Cueing to avoid hip/trunk flexion  tandem walk along TM Light finger support required   wall bumps EO/EC shoulder and hip strategy  Good glute control with hip strategy   alt toe tap to cone on foam              HOME EXERCISE PROGRAM Last updated: 04/03/22 Access Code: [email or web address] URL: [email or web address] Date: 04/03/2022 Prepared by: [facility]  Exercises - Hooklying Single Knee to Chest Stretch  - 1-2 x  daily - 7 x weekly - 1 sets - 3-5 reps - 15-30 sec hold - Supine Lower Trunk Rotation  - 1-2 x daily - 7 x weekly - 1 sets - 5 reps - 15-30 sec hold - Supine Bridge  - 1 x daily - 5 x weekly - 2 sets - 10 reps - Sidelying Open Book Thoracic Lumbar Rotation and Extension  - 1 x daily - 5 x weekly - 2 sets - 10 reps - Seated Pelvic Tilt  - 1 x daily - 5 x weekly - 2 sets - 10 reps - Tandem Walking with Counter Support  - 1 x daily - 5 x weekly - 1 sets - 5 reps   PATIENT EDUCATION: Education details: review and update to HEP Person educated: Patient Education method: Consulting civil engineer, Demonstration, Tactile cues, Verbal cues, and Handouts Education comprehension: verbalized understanding and returned demonstration    Below measures were taken at time of initial evaluation unless otherwise specified:   DIAGNOSTIC FINDINGS: NA  COGNITION: Overall cognitive status: Within functional limits for tasks assessed   SENSATION: Light touch: WFL  POSTURE: rounded shoulders, forward head, and posterior pelvic tilt  Repeated motions Lumbar flexion:  pain unchanged; pain into bilateral posterior (tightness) Lumbar extension:  pain slightly improved  Hamstring ROM:  Passively from supine 90/90:  -21 degrees from full extension on R; -24 degrees from full extension on L SLR test:  tightness in low back noted at 70 degrees L, 64 degrees on R LOWER EXTREMITY ROM:   WFL  Active  Right Eval Left Eval  Hip flexion    Hip extension    Hip abduction    Hip adduction    Hip internal rotation    Hip external rotation    Knee flexion    Knee extension    Ankle dorsiflexion    Ankle plantarflexion    Ankle inversion    Ankle eversion     (Blank rows = not  tested)  LOWER EXTREMITY MMT:    MMT Right Eval Left Eval  Hip flexion 3+ 4  Hip extension    Hip abduction    Hip adduction    Hip internal rotation    Hip external rotation    Knee flexion 4 4  Knee extension 4 4  Ankle dorsiflexion  3+ 3+  Ankle plantarflexion    Ankle inversion    Ankle eversion    (Blank rows = not tested)   TRANSFERS: Assistive device utilized: None  Sit to stand: Modified independence Stand to sit: Modified independence   GAIT: Gait pattern:  wide BOS and step through pattern Distance walked: 40 ft x 2 Assistive device utilized: None Level of assistance: Modified independence Comments: NA  FUNCTIONAL TESTS:  5 times sit to stand: 15 sec   M-CTSIB  Condition 1: Firm Surface, EO 30 Sec, Normal Sway  Condition 2: Firm Surface, EC 30 Sec, Mild Sway  Condition 3: Foam Surface, EO 30 Sec, Normal Sway  Condition 4: Foam Surface, EC 20.25 Sec, Severe Sway    PATIENT SURVEYS:  NA  TODAY'S TREATMENT:                                                                                                                              DATE: 03/25/2022    PATIENT EDUCATION: Education details: PT eval results, POC, HEP initiated Person educated: Patient Education method: Explanation, Demonstration, and Handouts Education comprehension: verbalized understanding, returned demonstration, and needs further education  HOME EXERCISE PROGRAM: Access Code: [email or web address] URL: [email or web address] Date: 03/25/2022 Prepared by: [facility]  Exercises - Supine Pelvic Tilt  - 1-2 x daily - 7 x weekly - 1 sets - 10 reps - 3 sec hold - Hooklying Single Knee to Chest Stretch  - 1-2 x daily - 7 x weekly - 1 sets - 3-5 reps - 15-30 sec hold - Supine Lower Trunk Rotation  - 1-2 x daily - 7 x weekly - 1 sets - 5 reps - 15-30 sec hold  GOALS: Goals reviewed with patient? Yes  SHORT TERM GOALS: Target date: 04/19/2022  Pt will be independent with HEP for improved strength, balance, decreased pain. Baseline: Goal status: IN PROGRESS  2.  Pt will improve 5x sit<>stand to less than or equal to 12 sec to demonstrate improved functional strength and transfer  efficiency. Baseline: 15 sec Goal status: IN PROGRESS  LONG TERM GOALS: Target date: 05/17/2022  Pt will be independent with HEP for improved transfers, balance, gait, decreased pain.. Baseline:  Goal status: IN PROGRESS  2.  Pt will report 2/10 or less pain in back with functional daily activities, for improved activity tolerance and participation. Baseline: 5/10 Goal status: IN PROGRESS  3.  Pt will perform 30 seconds on Condition 4 of MCTSIB with moderate or better sway, for improved balance. Baseline: 20 sec severe sway. Goal status: IN PROGRESS  4.  Further gait and balance testing to be assessed as appropriate. Baseline:  Goal status: IN PROGRESS  ASSESSMENT:  CLINICAL IMPRESSION: Patient arrived to session without new complaints. Reviewed HEP which required adjustment of positioning with pelvic tilts for max understanding and success. Worked on progressing core stabilization activities with patient demonstrating good focus and control. Glute weakness evident with bridges d/t limited amplitude lift. Initiated balance activities with patient demonstrating hip weakness causing lateral trunk lean and imbalance. Demonstrated good effort and control with EC activities and without excessive LOB. Patient reported understanding of all edu provided and without complaints at end of session.     OBJECTIVE IMPAIRMENTS: Abnormal gait, decreased balance, decreased mobility, difficulty walking, decreased ROM, decreased strength, dizziness, impaired flexibility, and postural dysfunction.   ACTIVITY LIMITATIONS: bending, sitting, standing, sleeping, transfers, bed mobility, and locomotion level  PARTICIPATION LIMITATIONS: meal prep, cleaning, laundry, and community activity  PERSONAL FACTORS: 3+ comorbidities: See above  are also affecting patient's functional outcome.   REHAB POTENTIAL: Good  CLINICAL DECISION MAKING: Evolving/moderate complexity  EVALUATION COMPLEXITY:  Moderate  PLAN:  PT FREQUENCY: 1x/week  PT DURATION: 8 weeks, including eval week  PLANNED INTERVENTIONS: Therapeutic exercises, Therapeutic activity, Neuromuscular re-education, Balance training, Gait training, Patient/Family education, Self Care, Joint mobilization, Vestibular training, Canalith repositioning, Electrical stimulation, Moist heat, and Manual therapy  PLAN FOR NEXT SESSION: progress exercises for lumbar and hamstring flexibility, lumbar and lower extremity strength, balance; further balance/gait testing as needed; with hx of vertigo, may need to assess for vertigo if she reports it has returned.   Janene Harvey, PT, DPT 04/03/22 2:47 PM  [facility] at [facility] [address] [place], [place], [place] [address] Phone # [phone] Fax # [phone]      PHYSICAL THERAPY DISCHARGE SUMMARY  Visits from Start of Care: 2  Current functional level related to goals / functional outcomes: Unable to assess; pt did not return   Remaining deficits: Unable to assess   Education / Equipment: HEP  Plan: Patient agrees to discharge.  Patient goals were not met. Patient is being discharged due to not returning to PT.    Janene Harvey, PT, DPT 05/22/22 10:25 AM  [facility] at [facility] [address] [place], [place], [place] [address] Phone # [phone] Fax # [phone]

## 2022-04-03 ENCOUNTER — Ambulatory Visit: Payer: Medicare Other | Admitting: Physical Therapy

## 2022-04-03 ENCOUNTER — Encounter: Payer: Self-pay | Admitting: Physical Therapy

## 2022-04-03 DIAGNOSIS — G609 Hereditary and idiopathic neuropathy, unspecified: Secondary | ICD-10-CM | POA: Diagnosis not present

## 2022-04-03 DIAGNOSIS — R2689 Other abnormalities of gait and mobility: Secondary | ICD-10-CM

## 2022-04-03 DIAGNOSIS — M6281 Muscle weakness (generalized): Secondary | ICD-10-CM | POA: Diagnosis not present

## 2022-04-03 DIAGNOSIS — M5459 Other low back pain: Secondary | ICD-10-CM | POA: Diagnosis not present

## 2022-04-03 DIAGNOSIS — R2681 Unsteadiness on feet: Secondary | ICD-10-CM

## 2022-04-08 ENCOUNTER — Ambulatory Visit: Payer: Medicare Other | Admitting: Physical Therapy

## 2022-04-22 ENCOUNTER — Telehealth: Payer: Self-pay | Admitting: Family Medicine

## 2022-04-22 DIAGNOSIS — Z124 Encounter for screening for malignant neoplasm of cervix: Secondary | ICD-10-CM | POA: Diagnosis not present

## 2022-04-22 DIAGNOSIS — Z1151 Encounter for screening for human papillomavirus (HPV): Secondary | ICD-10-CM | POA: Diagnosis not present

## 2022-04-22 DIAGNOSIS — Z1231 Encounter for screening mammogram for malignant neoplasm of breast: Secondary | ICD-10-CM | POA: Diagnosis not present

## 2022-04-22 DIAGNOSIS — Z6837 Body mass index (BMI) 37.0-37.9, adult: Secondary | ICD-10-CM | POA: Diagnosis not present

## 2022-04-22 NOTE — Telephone Encounter (Signed)
Blood pressure was extremely high while at another doctor's office. Wanted to let provider know to see if they would call in something. Patient also scheduled appointment

## 2022-04-22 NOTE — Telephone Encounter (Signed)
I called the patient for more information.  Patient stated she was at the OB/GYN office today and was told her blood pressure was 188/92, repeat check was 525 systolic and she cannot recall the diastolic number.  Patient states she has only had a headache which she thought was due to the heat in her home and denies any shortness of breath, dizziness, lightheadedness, chest pain or other symptoms.  Appt was scheduled for 12/20 and the 12/26 appt was cancelled.

## 2022-04-24 ENCOUNTER — Ambulatory Visit (INDEPENDENT_AMBULATORY_CARE_PROVIDER_SITE_OTHER): Payer: Medicare Other | Admitting: Family Medicine

## 2022-04-24 ENCOUNTER — Encounter: Payer: Self-pay | Admitting: Family Medicine

## 2022-04-24 VITALS — BP 150/84 | HR 79 | Temp 97.6°F | Ht 61.5 in | Wt 198.2 lb

## 2022-04-24 DIAGNOSIS — N3281 Overactive bladder: Secondary | ICD-10-CM

## 2022-04-24 DIAGNOSIS — I10 Essential (primary) hypertension: Secondary | ICD-10-CM | POA: Diagnosis not present

## 2022-04-24 MED ORDER — TOLTERODINE TARTRATE 2 MG PO TABS: 2.0000 mg | ORAL_TABLET | Freq: Two times a day (BID) | ORAL | 3 refills | Status: DC

## 2022-04-24 MED ORDER — TELMISARTAN 20 MG PO TABS: 20.0000 mg | ORAL_TABLET | Freq: Every day | ORAL | 1 refills | Status: DC

## 2022-04-24 NOTE — Patient Instructions (Signed)
Call or message me on my chart in about 3-4 weeks to let me know what your blood pressure is doing.

## 2022-04-24 NOTE — Assessment & Plan Note (Signed)
Will stop the myrbetric and restart her detrol 2 mg BID for her overactive bladder.

## 2022-04-24 NOTE — Assessment & Plan Note (Signed)
New diagnosis, will start telmisartan 20 mg daily and I will see her back in 3 months for a recheck of her BP. Patient wil continue to monitor her BP at home and she will let me know what she is getting at home.

## 2022-04-24 NOTE — Progress Notes (Signed)
Established Patient Office Visit  Subjective   Patient ID: Cambryn Charters, female    DOB: 1955-09-16  Age: 66 y.o. MRN: [id number]  Chief Complaint  Patient presents with   Hypertension    Patient states her blood pressure was elevated during visit with OB/GYN 2 days ago (reading of 188/92) and patient states she has been nervous during every doctor's visit for years   Medication Problem    Patient states she "does not like the way she feels while taking Myrbetriq, feels dried out", cannot specifically state an exact reaction and requests to restart Detrol '2mg'$ -BID (previously given by Dr Ethlyn Gallery)    Patient is here to discuss her elevated blood pressure. States that she went to her GYN and her BP was over 180. Patient was very concerned about this. She reports that she is having trouble using her BP cuff at home and was wondering if she could use a wrist cuff. She denies any chest pain, no SOB and no dizziness or headaches.   Patient states she is having side effects on the myrbetric. Would like to go back to her detrol.   Current Outpatient Medications  Medication Instructions   busPIRone (BUSPAR) 15 mg, Oral, 2 times daily   Cobalamin Combinations (B-12) 1000-400 MCG SUBL Sublingual, Daily   febuxostat (ULORIC) 40 mg, Oral, Daily   fexofenadine (ALLEGRA) 180 mg, Oral, Daily   levothyroxine (SYNTHROID) 100 mcg, Oral, Daily   omeprazole (PRILOSEC) 40 mg, Oral, Daily   pregabalin (LYRICA) 225 mg, Oral, 2 times daily   rosuvastatin (CRESTOR) 2.5 mg, Oral, Daily   sertraline (ZOLOFT) 100 MG tablet TAKE 1.5 TABLETS BY MOUTH EVERY DAY   telmisartan (MICARDIS) 20 mg, Oral, Daily at bedtime   tolterodine (DETROL) 2 mg, Oral, 2 times daily   Vitamin D (Ergocalciferol) (DRISDOL) 50,000 Units, Oral, Every 7 days    Patient Active Problem List   Diagnosis Date Noted   Primary hypertension 04/24/2022   Elevated blood pressure reading 01/25/2022   Prediabetes 01/24/2022   Vitamin D  deficiency 04/26/2020   Hyperglycemia 04/26/2020   Hyperlipidemia 04/26/2020   B12 deficiency 01/05/2020   Overactive bladder 01/05/2020   Fatty liver 01/05/2020   Gout    Hypothyroidism    GERD (gastroesophageal reflux disease)    Idiopathic peripheral neuropathy 09/19/2016   Depression, recurrent ([place]) 09/19/2016   Anxiety 09/19/2016   Neurogenic bladder 09/19/2016      ROS    Objective:     BP (!) 150/84 (BP Location: Right Arm, Patient Position: Sitting)   Pulse 79   Temp 97.6 F (36.4 C) (Oral)   Ht 5' 1.5" (1.562 m)   Wt 198 lb 3.2 oz (89.9 kg)   LMP 05/06/2006 (Approximate)   SpO2 100%   BMI 36.84 kg/m  BP Readings from Last 3 Encounters:  04/24/22 (!) 150/84  02/06/22 (!) 160/82  01/24/22 (!) 142/60      Physical Exam Vitals reviewed.  Constitutional:      Appearance: Normal appearance. She is well-groomed and normal weight.  Eyes:     Conjunctiva/sclera: Conjunctivae normal.  Neck:     Thyroid: No thyromegaly.  Cardiovascular:     Rate and Rhythm: Normal rate and regular rhythm.     Pulses: Normal pulses.     Heart sounds: S1 normal and S2 normal.  Pulmonary:     Effort: Pulmonary effort is normal.     Breath sounds: Normal breath sounds and air entry.  Abdominal:  General: Bowel sounds are normal.  Musculoskeletal:     Right lower leg: No edema.     Left lower leg: No edema.  Neurological:     Mental Status: She is alert and oriented to person, place, and time. Mental status is at baseline.     Gait: Gait is intact.  Psychiatric:        Mood and Affect: Mood and affect normal.        Speech: Speech normal.        Behavior: Behavior normal.        Judgment: Judgment normal.      No results found for any visits on 04/24/22.    The 10-year ASCVD risk score (Arnett DK, et al., 2019) is: 10.6%    Assessment & Plan:   Problem List Items Addressed This Visit       Unprioritized   Overactive bladder - Primary    Will stop the  myrbetric and restart her detrol 2 mg BID for her overactive bladder.       Relevant Medications   tolterodine (DETROL) 2 MG tablet   Primary hypertension    New diagnosis, will start telmisartan 20 mg daily and I will see her back in 3 months for a recheck of her BP. Patient wil continue to monitor her BP at home and she will let me know what she is getting at home.       Relevant Medications   telmisartan (MICARDIS) 20 MG tablet    Return in about 3 months (around 07/24/2022) for follow up BP.    Farrel Conners, MD

## 2022-04-30 ENCOUNTER — Ambulatory Visit: Payer: Medicare Other | Admitting: Family Medicine

## 2022-04-30 ENCOUNTER — Ambulatory Visit: Payer: Medicare Other | Admitting: Physical Therapy

## 2022-05-01 ENCOUNTER — Telehealth: Payer: Self-pay | Admitting: Family Medicine

## 2022-05-01 ENCOUNTER — Ambulatory Visit: Payer: Medicare Other | Admitting: Physical Therapy

## 2022-05-01 DIAGNOSIS — Z1239 Encounter for other screening for malignant neoplasm of breast: Secondary | ICD-10-CM

## 2022-05-01 NOTE — Telephone Encounter (Signed)
Ok to send order to [facility]

## 2022-05-01 NOTE — Telephone Encounter (Signed)
Patient; called back into the office asking to speak to the practice administrator about diagnostic mammogram.Patient was made aware of below message. Per the end of the conversation patient stated she would call [facility] to follow up.

## 2022-05-01 NOTE — Telephone Encounter (Signed)
Pt called to request a referral to [facility].  Fax:  [phone]  Needs referral by Friday 05/03/22  Pt's appointment is at 11 am.  LOV:  04/24/22  Please advise.

## 2022-05-01 NOTE — Telephone Encounter (Signed)
Spoke with the patient and informed her the order was faxed to [facility] at [phone].  Patient asked if this was for a diagnostic mammogram and [place] and I informed her the order was sent for a screening mammogram only.  Patient was informed a visit is needed for evaluation prior to a diagnostic mammogram and [place] being ordered.  Patient began to talk loud, told me to listen and let her talk and stated this is not what she requested as she had a long discussion with the person she left a message with earlier as this is not what she needed as [facility] told her this was needed from testing years ago.  Patient was advised the general process if diagnostic tests are needed, the facility would fax over the order to the provider to review.  I looked under the e-faxes for Dr Legrand Como and there was a fax order from [facility].  I explained to the patient I will not argue with her and the fax will be given to Dr Legrand Como for review and faxed if signed.   PCP was informed of this message, signed the order, this was faxed to [facility] at [phone] and sent to be scanned.

## 2022-05-03 DIAGNOSIS — R928 Other abnormal and inconclusive findings on diagnostic imaging of breast: Secondary | ICD-10-CM | POA: Diagnosis not present

## 2022-06-25 ENCOUNTER — Encounter: Payer: Self-pay | Admitting: Family Medicine

## 2022-06-25 DIAGNOSIS — I739 Peripheral vascular disease, unspecified: Secondary | ICD-10-CM

## 2022-07-16 ENCOUNTER — Encounter (HOSPITAL_COMMUNITY): Payer: Self-pay

## 2022-07-16 ENCOUNTER — Ambulatory Visit (HOSPITAL_COMMUNITY)
Admission: EM | Admit: 2022-07-16 | Discharge: 2022-07-16 | Disposition: A | Discharge status: Home / Self Care | Payer: Medicare Other

## 2022-07-16 ENCOUNTER — Encounter (HOSPITAL_COMMUNITY): Payer: Self-pay | Admitting: Emergency Medicine

## 2022-07-16 ENCOUNTER — Emergency Department (HOSPITAL_COMMUNITY)
Admission: EM | Admit: 2022-07-16 | Discharge: 2022-07-16 | Disposition: A | Discharge status: Home / Self Care | Payer: Medicare Other | Attending: Emergency Medicine | Admitting: Emergency Medicine

## 2022-07-16 ENCOUNTER — Other Ambulatory Visit: Payer: Self-pay

## 2022-07-16 DIAGNOSIS — Z79899 Other long term (current) drug therapy: Secondary | ICD-10-CM | POA: Diagnosis not present

## 2022-07-16 DIAGNOSIS — M79672 Pain in left foot: Secondary | ICD-10-CM

## 2022-07-16 DIAGNOSIS — M79671 Pain in right foot: Secondary | ICD-10-CM

## 2022-07-16 DIAGNOSIS — I10 Essential (primary) hypertension: Secondary | ICD-10-CM | POA: Insufficient documentation

## 2022-07-16 DIAGNOSIS — R238 Other skin changes: Secondary | ICD-10-CM

## 2022-07-16 HISTORY — DX: Essential (primary) hypertension: I10

## 2022-07-16 LAB — BASIC METABOLIC PANEL
Anion gap: 11 (ref 5–15)
BUN: 13 mg/dL (ref 8–23)
CO2: 23 mmol/L (ref 22–32)
Calcium: 9.3 mg/dL (ref 8.9–10.3)
Chloride: 103 mmol/L (ref 98–111)
Creatinine, Ser: 0.89 mg/dL (ref 0.44–1.00)
GFR, Estimated: >60 mL/min (ref >60)
Glucose, Bld: 99 mg/dL (ref 70–99)
Potassium: 3.9 mmol/L (ref 3.5–5.1)
Sodium: 137 mmol/L (ref 135–145)

## 2022-07-16 LAB — CBC
HCT: 38.7 % (ref 36.0–46.0)
Hemoglobin: 13.6 g/dL (ref 12.0–15.0)
MCH: 30.1 pg (ref 26.0–34.0)
MCHC: 35.1 g/dL (ref 30.0–36.0)
MCV: 85.6 fL (ref 80.0–100.0)
Platelets: 188 10*3/uL (ref 150–400)
RBC: 4.52 MIL/uL (ref 3.87–5.11)
RDW: 12.3 % (ref 11.5–15.5)
WBC: 6.5 10*3/uL (ref 4.0–10.5)
nRBC: 0 % (ref 0.0–0.2)

## 2022-07-16 NOTE — ED Provider Triage Note (Signed)
Emergency Medicine Provider Triage Evaluation Note  Samantha Burch , a 67 y.o. female  was evaluated in triage.  Pt complains of dusky feet.  Patient was seen at urgent care and was advised to come to the emergency department for further evaluation.  She reports that she has a known history of circulatory issues in bilateral feet with some worsening neuropathy.  Also reports she gets easy blistering in her feet as well.  Denies any significant pain or warmth from her legs.  Currently prediabetic.  Has previously been evaluated by vascular for circulatory issues but has not had significant follow-up for treatment or further interventions as needed.  Currently uses compression stockings for assistance on elevates her feet when they become highly symptomatic which typically resolves..  Review of Systems  Positive: As above Negative: As above  Physical Exam  BP (!) 140/86 (BP Location: Right Arm)   Pulse 85   Temp 98 F (36.7 C) (Oral)   Resp 18   LMP 05/06/2006 (Approximate)   SpO2 97%  Gen:   Awake, no distress   Resp:  Normal effort  MSK:   Moves extremities without difficulty  Other:  No significant calf tenderness.  Weak PT pulses.  Toes began to become dusky and red/purple with pain minute of having them down.  Medical Decision Making  Medically screening exam initiated at 12:11 PM.  Appropriate orders placed.  Samantha Burch was informed that the remainder of the evaluation will be completed by another provider, this initial triage assessment does not replace that evaluation, and the importance of remaining in the ED until their evaluation is complete.     [name], PA-C 07/16/22 1213

## 2022-07-16 NOTE — ED Provider Notes (Signed)
[place]    CSN: [id number] Arrival date & time: 07/16/22  [id number]      History   Chief Complaint No chief complaint on file.   HPI Samantha Burch is a 67 y.o. female.   Patient presents to urgent care for evaluation of color change and temperature change to the left foot versus the right foot that has been present for the past couple of months but worsened over the last few days.  She states that she has had chronic venous insufficiency for the last 6 years and varicose veins for which she has been using compression stockings.  She was seen by Dr. Donnetta Hutching in 2019 where she was evaluated and found to have varicose veins and chronic venous insufficiency.  Over the last 4 years, she has developed worsening peripheral neuropathy, prediabetes, and worsening color change to the bilateral toes.  Over the last couple of weeks, she states she has noticed worsening pain to the bilateral feet when walking but did not look at her feet until a few days ago when she noticed blisters and swelling to the bilateral toes.  The left toes are cold, dusky, and erythematous.  The right toes are warm, erythematous, and swollen.  Takes Lyrica daily.  She is a former smoker and quit in 2013.  History of hyperlipidemia, hypertension, and prediabetes.  Denies recent long car rides, periods of travel, or calf pain.  Denies cough, shortness of breath, and orthopnea.  No fever/chills reported.  No recent antibiotic or steroid use.  She attempted to call her PCP to schedule an appointment and get a referral to vascular and vein office but was told to come to urgent care for referral to vascular and vein.     Past Medical History:  Diagnosis Date   Anxiety    Depression    GERD (gastroesophageal reflux disease)    Gout    Hyperlipidemia    Hypertension    Hypoglycemia    Hypothyroidism    Neurogenic bladder    Neuropathy    Urinary incontinence     Patient Active Problem List   Diagnosis  Date Noted   Primary hypertension 04/24/2022   Elevated blood pressure reading 01/25/2022   Prediabetes 01/24/2022   Vitamin D deficiency 04/26/2020   Hyperglycemia 04/26/2020   Hyperlipidemia 04/26/2020   B12 deficiency 01/05/2020   Overactive bladder 01/05/2020   Fatty liver 01/05/2020   Gout    Hypothyroidism    GERD (gastroesophageal reflux disease)    Idiopathic peripheral neuropathy 09/19/2016   Depression, recurrent ([place]) 09/19/2016   Anxiety 09/19/2016   Neurogenic bladder 09/19/2016    Past Surgical History:  Procedure Laterality Date   MULTIPLE TOOTH EXTRACTIONS  2003-2004   for dentures    OB History     Gravida  1   Para  1   Term      Preterm      AB      Living         SAB      IAB      Ectopic      Multiple      Live Births               Home Medications    Prior to Admission medications   Medication Sig Start Date End Date Taking? Authorizing Provider  busPIRone (BUSPAR) 15 MG tablet Take 1 tablet (15 mg total) by mouth 2 (two) times daily. 01/24/22  Farrel Conners, MD  Cobalamin Combinations (B-12) 1000-400 MCG SUBL Place under the tongue daily.    [provider]  febuxostat (ULORIC) 40 MG tablet Take 40 mg by mouth daily.    [provider]  fexofenadine (ALLEGRA) 180 MG tablet Take 1 tablet (180 mg total) by mouth daily. 02/03/22   Farrel Conners, MD  levothyroxine (SYNTHROID) 100 MCG tablet Take 1 tablet (100 mcg total) by mouth daily. 01/24/22   Farrel Conners, MD  omeprazole (PRILOSEC) 40 MG capsule Take 1 capsule (40 mg total) by mouth daily. 01/24/22   Farrel Conners, MD  pregabalin (LYRICA) 225 MG capsule Take 1 capsule (225 mg total) by mouth 2 (two) times daily. 02/06/22   Cameron Sprang, MD  rosuvastatin (CRESTOR) 5 MG tablet Take 0.5 tablets (2.5 mg total) by mouth daily. 03/15/22   Farrel Conners, MD  sertraline (ZOLOFT) 100 MG tablet TAKE 1.5 TABLETS BY MOUTH EVERY DAY 01/24/22    Farrel Conners, MD  telmisartan (MICARDIS) 20 MG tablet Take 1 tablet (20 mg total) by mouth at bedtime. 04/24/22   Farrel Conners, MD  tolterodine (DETROL) 2 MG tablet Take 1 tablet (2 mg total) by mouth 2 (two) times daily. 04/24/22   Farrel Conners, MD  Vitamin D, Ergocalciferol, (DRISDOL) 1.25 MG (50000 UNIT) CAPS capsule Take 1 capsule (50,000 Units total) by mouth every 7 (seven) days. 01/24/22   Farrel Conners, MD    Family History Family History  Problem Relation Age of Onset   Ovarian cancer Mother    Alcohol abuse Father    Hypertension Father    Diabetes type II Father    Neuropathy Father    Neuropathy Paternal Grandmother    Colon cancer Maternal Uncle     Social History Social History   Tobacco Use   Smoking status: Former    Packs/day: 1.00    Years: 30.00    Total pack years: 30.00    Types: Cigarettes    Quit date: 05/07/2011    Years since quitting: 11.2   Smokeless tobacco: Never  Vaping Use   Vaping Use: Never used  Substance Use Topics   Alcohol use: No   Drug use: No     Allergies   Elemental sulfur, Lipitor [atorvastatin], Pravastatin, and Sulfa antibiotics   Review of Systems Review of Systems Per HPI  Physical Exam Triage Vital Signs ED Triage Vitals  Enc Vitals Group     BP 07/16/22 1035 125/82     Pulse Rate 07/16/22 1035 66     Resp 07/16/22 1035 18     Temp 07/16/22 1035 97.9 F (36.6 C)     Temp Source 07/16/22 1035 Oral     SpO2 07/16/22 1035 97 %     Weight --      Height --      Head Circumference --      Peak Flow --      Pain Score 07/16/22 1034 8     Pain Loc --      Pain Edu? --      Excl. in [place]? --    No data found.  Updated Vital Signs BP 125/82 (BP Location: Left Arm)   Pulse 66   Temp 97.9 F (36.6 C) (Oral)   Resp 18   LMP 05/06/2006 (Approximate)   SpO2 97%   Visual Acuity Right Eye Distance:   Left Eye Distance:   Bilateral Distance:  Right Eye Near:   Left Eye Near:     Bilateral Near:     Physical Exam Vitals and nursing note reviewed.  Constitutional:      Appearance: She is not ill-appearing or toxic-appearing.  HENT:     Head: Normocephalic and atraumatic.     Right Ear: Hearing and external ear normal.     Left Ear: Hearing and external ear normal.     Nose: Nose normal.     Mouth/Throat:     Lips: Pink.     Mouth: Mucous membranes are moist.     Pharynx: No posterior oropharyngeal erythema.  Eyes:     General: Lids are normal. Vision grossly intact. Gaze aligned appropriately.     Extraocular Movements: Extraocular movements intact.     Conjunctiva/sclera: Conjunctivae normal.  Cardiovascular:     Rate and Rhythm: Normal rate and regular rhythm.     Heart sounds: Normal heart sounds, S1 normal and S2 normal.  Pulmonary:     Effort: Pulmonary effort is normal. No respiratory distress.     Breath sounds: Normal breath sounds and air entry.  Musculoskeletal:     Cervical back: Neck supple.  Skin:    General: Skin is warm and dry.     Capillary Refill: Capillary refill takes 2 to 3 seconds.     Findings: Erythema present. No rash.     Comments: Capillary refill 2-3 seconds to bilateral lower extremities. Toes appear to be dusky and mottled to bilateral feet. Decreased sensation to distal bilateral lower extremities. See images below. +2 DP pulses bilaterally. Distal left foot is cooler to touch than distal right lower extremity.   Neurological:     General: No focal deficit present.     Mental Status: She is alert and oriented to person, place, and time. Mental status is at baseline.     Cranial Nerves: No dysarthria or facial asymmetry.  Psychiatric:        Mood and Affect: Mood normal.        Speech: Speech normal.        Behavior: Behavior normal.        Thought Content: Thought content normal.        Judgment: Judgment normal.        UC Treatments / Results  Labs (all labs ordered are listed, but only abnormal results are  displayed) Labs Reviewed - No data to display  EKG   Radiology No results found.  Procedures Procedures (including critical care time)  Medications Ordered in UC Medications - No data to display  Initial Impression / Assessment and Plan / UC Course  I have reviewed the triage vital signs and the nursing notes.  Pertinent labs & imaging results that were available during my care of the patient were reviewed by me and considered in my medical decision making (see chart for details).   1. Dusky feet Exam is concerning for limited blood flow and vascular compromise to the bilateral lower extremities, particularly the left lower extremity to the distal left foot. Discussed case with second provider Dr. Eustace Moore who agrees patient would benefit from evaluation in the emergency department. Patient's feet are mottled and swollen at baseline, however the temperature change and capillary refill right at 3 seconds are concerning for worsening/limited blood flow to the distal feet. She is agreeable to workup at [facility] ER. Discussed risks of deferring ED visit to which she expresses understanding and agreement with plan. Patient discharged from urgent  care in stable condition.  Final Clinical Impressions(s) / UC Diagnoses   Final diagnoses:  Dusky feet     Discharge Instructions      He is to report to the nearest emergency department immediately for further evaluation of your circulatory problems to your feet as I believe that this needs to be worked up emergently today.     ED Prescriptions   None    PDMP not reviewed this encounter.   Talbot Grumbling, [place] 07/16/22 2151

## 2022-07-16 NOTE — ED Triage Notes (Signed)
Pt has peripheral neuropathy. Reports having blisters coming up on bottom of bilateral feet. Reports unless elevates legs then toes will turn black. Pt c/o bilat leg pains. Reports that gait is wobbly due to loss of sensation.

## 2022-07-16 NOTE — ED Provider Notes (Signed)
[facility] Provider Note   CSN: [id number] Arrival date & time: 07/16/22  1132     History {Add pertinent medical, surgical, social history, OB history to HPI:1} Chief Complaint  Patient presents with   Foot Pain    Samantha Burch is a 67 y.o. female.   Foot Pain  Patient sent from urgent care after being reported sent from PCP to urgent care.  Reportedly has chronic neuropathy in her feet.  Has some erythema and purplish discoloration of her feet.  Decreased sensation.  Has been seen by Dr. Donnetta Hutching around 5 years ago and told she had varicose veins.  Now states she noticed some blistering on the feet.  Reportedly tried to get a consult to vascular surgery through PCP and was told to go to urgent care.  Urgent care evaluated her and sent her here.  No real pain in her feet.  No fevers.  States they will get more dusky if they are hanging down.    Past Medical History:  Diagnosis Date   Anxiety    Depression    GERD (gastroesophageal reflux disease)    Gout    Hyperlipidemia    Hypertension    Hypoglycemia    Hypothyroidism    Neurogenic bladder    Neuropathy    Urinary incontinence     Home Medications Prior to Admission medications   Medication Sig Start Date End Date Taking? Authorizing Provider  busPIRone (BUSPAR) 15 MG tablet Take 1 tablet (15 mg total) by mouth 2 (two) times daily. 01/24/22   Farrel Conners, MD  Cobalamin Combinations (B-12) 1000-400 MCG SUBL Place under the tongue daily.    [provider]  febuxostat (ULORIC) 40 MG tablet Take 40 mg by mouth daily.    [provider]  fexofenadine (ALLEGRA) 180 MG tablet Take 1 tablet (180 mg total) by mouth daily. 02/03/22   Farrel Conners, MD  levothyroxine (SYNTHROID) 100 MCG tablet Take 1 tablet (100 mcg total) by mouth daily. 01/24/22   Farrel Conners, MD  omeprazole (PRILOSEC) 40 MG capsule Take 1 capsule (40 mg total) by mouth  daily. 01/24/22   Farrel Conners, MD  pregabalin (LYRICA) 225 MG capsule Take 1 capsule (225 mg total) by mouth 2 (two) times daily. 02/06/22   Cameron Sprang, MD  rosuvastatin (CRESTOR) 5 MG tablet Take 0.5 tablets (2.5 mg total) by mouth daily. 03/15/22   Farrel Conners, MD  sertraline (ZOLOFT) 100 MG tablet TAKE 1.5 TABLETS BY MOUTH EVERY DAY 01/24/22   Farrel Conners, MD  telmisartan (MICARDIS) 20 MG tablet Take 1 tablet (20 mg total) by mouth at bedtime. 04/24/22   Farrel Conners, MD  tolterodine (DETROL) 2 MG tablet Take 1 tablet (2 mg total) by mouth 2 (two) times daily. 04/24/22   Farrel Conners, MD  Vitamin D, Ergocalciferol, (DRISDOL) 1.25 MG (50000 UNIT) CAPS capsule Take 1 capsule (50,000 Units total) by mouth every 7 (seven) days. 01/24/22   Farrel Conners, MD      Allergies    Elemental sulfur, Lipitor [atorvastatin], Pravastatin, and Sulfa antibiotics    Review of Systems   Review of Systems  Physical Exam Updated Vital Signs BP (!) 140/86 (BP Location: Right Arm)   Pulse 85   Temp 98 F (36.7 C) (Oral)   Resp 18   Ht 5' 1.5" (1.562 m)   Wt 89.4 kg   LMP 05/06/2006 (Approximate)  SpO2 97%   BMI 36.62 kg/m  Physical Exam Vitals and nursing note reviewed.  Eyes:     Pupils: Pupils are equal, round, and reactive to light.  Musculoskeletal:     Comments: Strong dorsalis pedis pulse bilaterally.  Does have some discoloration of both feet.  Particular on toes and plantar aspect of right foot.  No edema.  Skin:    General: Skin is warm.  Neurological:     Mental Status: She is alert and oriented to person, place, and time.          ED Results / Procedures / Treatments   Labs (all labs ordered are listed, but only abnormal results are displayed) Labs Reviewed  CBC  BASIC METABOLIC PANEL    EKG None  Radiology No results found.  Procedures Procedures  {Document cardiac monitor, telemetry assessment procedure when  appropriate:1}  Medications Ordered in ED Medications - No data to display  ED Course/ Medical Decision Making/ A&P   {   Click here for ABCD2, HEART and other calculatorsREFRESH Note before signing :1}                          Medical Decision Making  Patient sent in for vascular surgery.  Has had some discoloration of feet but does have strong dorsalis pedis pulse.  Doubt DVT.  No fevers.  Does not appear acutely infected.  Will discuss with Dr. Donzetta Matters from vascular surgery.  {Document critical care time when appropriate:1} {Document review of labs and clinical decision tools ie heart score, Chads2Vasc2 etc:1}  {Document your independent review of radiology images, and any outside records:1} {Document your discussion with family members, caretakers, and with consultants:1} {Document social determinants of health affecting pt's care:1} {Document your decision making why or why not admission, treatments were needed:1} Final Clinical Impression(s) / ED Diagnoses Final diagnoses:  None    Rx / DC Orders ED Discharge Orders     None

## 2022-07-16 NOTE — Discharge Instructions (Signed)
Follow-up with vascular surgery.  There is good flow to the big vessels in your feet.  Does not appear to be infected at this time.

## 2022-07-16 NOTE — ED Triage Notes (Signed)
Pt sent by urgent care for further evaluation of circulatory issues of bilateral feet, blistering to bottoms of both feet; worsening for several months; hx peripheral neuropathy; moderate pedal pulses present; redness, mottling to toes; toes cool to touch, decreased sensation

## 2022-07-16 NOTE — Discharge Instructions (Signed)
He is to report to the nearest emergency department immediately for further evaluation of your circulatory problems to your feet as I believe that this needs to be worked up emergently today.

## 2022-07-18 NOTE — Telephone Encounter (Signed)
I spoke with the patient on the phone and I reviewed the note from the ER. I placed an URGENT referral to Dr. Sherren Mocha and I advised her that if her foot became worse she would need to return to the ER.

## 2022-07-22 ENCOUNTER — Other Ambulatory Visit: Payer: Self-pay | Admitting: *Deleted

## 2022-07-22 ENCOUNTER — Ambulatory Visit (HOSPITAL_COMMUNITY)
Admission: RE | Admit: 2022-07-22 | Discharge: 2022-07-22 | Disposition: A | Discharge status: Home / Self Care | Payer: Medicare Other | Source: Ambulatory Visit | Attending: Surgery | Admitting: Surgery

## 2022-07-22 DIAGNOSIS — I739 Peripheral vascular disease, unspecified: Secondary | ICD-10-CM

## 2022-07-22 LAB — VAS US ABI WITH/WO TBI
Left ABI: 1.04
Right ABI: 1.01

## 2022-07-22 NOTE — Progress Notes (Signed)
[facility] [place]  ASSESSMENT / PLAN: Samantha Burch is a 67 y.o. female with Raynaud's phenomenon of bilateral feet without gangrene. She has a reassuring physical exam and non-invasive testing. Amlodipine may be helpful. Encouraged walking and healthy lifestyle. Follow up with me on as-needed basis.  CHIEF COMPLAINT: discoloration of bilateral feet  HISTORY OF PRESENT ILLNESS: Samantha Burch is a 67 y.o. female with bluish discoloration of bilateral feet.  This is occurred sporadically over the past several weeks.  The patient has a long history of neuropathy, beginning in her 30s.  She has virtually no sensation in the feet.  She has trouble with balance.  She does not describe any discomfort with walking.  She has no ulcers about her feet.  She does a good job caring for her feet, and wears well-fitting shoes to prevent ulceration.  Past Medical History:  Diagnosis Date   Anxiety    Depression    GERD (gastroesophageal reflux disease)    Gout    Hyperlipidemia    Hypertension    Hypoglycemia    Hypothyroidism    Neurogenic bladder    Neuropathy    Urinary incontinence     Past Surgical History:  Procedure Laterality Date   MULTIPLE TOOTH EXTRACTIONS  2003-2004   for dentures    Family History  Problem Relation Age of Onset   Ovarian cancer Mother    Alcohol abuse Father    Hypertension Father    Diabetes type II Father    Neuropathy Father    Neuropathy Paternal Grandmother    Colon cancer Maternal Uncle     Social History   Socioeconomic History   Marital status: Divorced    Spouse name: Not on file   Number of children: Not on file   Years of education: Not on file   Highest education level: Not on file  Occupational History   Occupation: Disabled  Tobacco Use   Smoking status: Former    Packs/day: 1.00    Years: 30.00    Additional pack years: 0.00    Total pack years: 30.00    Types: Cigarettes    Quit date:  05/07/2011    Years since quitting: 11.2   Smokeless tobacco: Never  Vaping Use   Vaping Use: Never used  Substance and Sexual Activity   Alcohol use: No   Drug use: No   Sexual activity: Never  Other Topics Concern   Not on file  Social History Narrative   Right handed      Highest level of edu- 12th grade      Son lives with pt ( age 61)      Social Determinants of Health   Financial Resource Strain: Low Risk  (01/24/2022)   Overall Financial Resource Strain (CARDIA)    Difficulty of Paying Living Expenses: Not hard at all  Food Insecurity: No Food Insecurity (01/24/2022)   Hunger Vital Sign    Worried About Running Out of Food in the Last Year: Never true    Ran Out of Food in the Last Year: Never true  Transportation Needs: No Transportation Needs (01/24/2022)   PRAPARE - Administrator, Civil Service (Medical): No    Lack of Transportation (Non-Medical): No  Physical Activity: Inactive (01/24/2022)   Exercise Vital Sign    Days of Exercise per Week: 0 days    Minutes of Exercise per Session: 0 min  Stress: No Stress Concern Present (01/24/2022)  [employer] of Occupational Health - Occupational Stress Questionnaire    Feeling of Stress : Not at all  Social Connections: Socially Isolated (01/24/2022)   Social Connection and Isolation Panel [NHANES]    Frequency of Communication with Friends and Family: More than three times a week    Frequency of Social Gatherings with Friends and Family: More than three times a week    Attends Religious Services: Never    Database administrator or Organizations: No    Attends Banker Meetings: Never    Marital Status: Divorced  Catering manager Violence: Not At Risk (01/24/2022)   Humiliation, Afraid, Rape, and Kick questionnaire    Fear of Current or Ex-Partner: No    Emotionally Abused: No    Physically Abused: No    Sexually Abused: No    Allergies  Allergen Reactions   Elemental Sulfur     Lipitor [Atorvastatin]     Cramping muscles   Pravastatin     cramping   Sulfa Antibiotics     Current Outpatient Medications  Medication Sig Dispense Refill   busPIRone (BUSPAR) 15 MG tablet Take 1 tablet (15 mg total) by mouth 2 (two) times daily. 180 tablet 3   Cobalamin Combinations (B-12) 1000-400 MCG SUBL Place under the tongue daily.     febuxostat (ULORIC) 40 MG tablet Take 40 mg by mouth daily.     fexofenadine (ALLEGRA) 180 MG tablet Take 1 tablet (180 mg total) by mouth daily. 90 tablet 1   levothyroxine (SYNTHROID) 100 MCG tablet Take 1 tablet (100 mcg total) by mouth daily. 90 tablet 1   omeprazole (PRILOSEC) 40 MG capsule Take 1 capsule (40 mg total) by mouth daily. 90 capsule 1   pregabalin (LYRICA) 225 MG capsule Take 1 capsule (225 mg total) by mouth 2 (two) times daily. 180 capsule 3   rosuvastatin (CRESTOR) 5 MG tablet Take 0.5 tablets (2.5 mg total) by mouth daily. 45 tablet 3   sertraline (ZOLOFT) 100 MG tablet TAKE 1.5 TABLETS BY MOUTH EVERY DAY 135 tablet 1   telmisartan (MICARDIS) 20 MG tablet Take 1 tablet (20 mg total) by mouth at bedtime. 90 tablet 1   tolterodine (DETROL) 2 MG tablet Take 1 tablet (2 mg total) by mouth 2 (two) times daily. 180 tablet 3   Vitamin D, Ergocalciferol, (DRISDOL) 1.25 MG (50000 UNIT) CAPS capsule Take 1 capsule (50,000 Units total) by mouth every 7 (seven) days. 12 capsule 1   No current facility-administered medications for this visit.    PHYSICAL EXAM There were no vitals filed for this visit.  Woman in no acute distress Regular rate and rhythm Unlabored breathing Easily palpable dorsalis pedis pulses bilaterally Mild cyanosis of toes bilaterally  PERTINENT LABORATORY AND RADIOLOGIC DATA  Most recent CBC    Latest Ref Rng & Units 07/16/2022   12:04 PM 09/17/2021    9:32 AM 02/08/2021    8:55 AM  CBC  WBC 4.0 - 10.5 K/uL 6.5  6.4  6.7   Hemoglobin 12.0 - 15.0 g/dL 08.6  57.8  46.9   Hematocrit 36.0 - 46.0 % 38.7  39.1   40.0   Platelets 150 - 400 K/uL 188  189.0  188.0      Most recent CMP    Latest Ref Rng & Units 07/16/2022   12:04 PM 09/17/2021    9:32 AM 02/08/2021    8:55 AM  CMP  Glucose 70 - 99 mg/dL 99  91  98  BUN 8 - 23 mg/dL 13  13  13    Creatinine 0.44 - 1.00 mg/dL 1.61  0.96  0.45   Sodium 135 - 145 mmol/L 137  140  141   Potassium 3.5 - 5.1 mmol/L 3.9  4.3  3.7   Chloride 98 - 111 mmol/L 103  102  103   CO2 22 - 32 mmol/L 23  30  31    Calcium 8.9 - 10.3 mg/dL 9.3  9.5  9.1   Total Protein 6.0 - 8.3 g/dL  7.0  6.8   Total Bilirubin 0.2 - 1.2 mg/dL  0.5  0.5   Alkaline Phos 39 - 117 U/L  72  75   AST 0 - 37 U/L  21  21   ALT 0 - 35 U/L  19  15     Renal function Estimated Creatinine Clearance: 64 mL/min (by C-G formula based on SCr of 0.89 mg/dL).  Hemoglobin A1C (%)  Date Value  01/24/2022 5.8 (A)   Hgb A1c MFr Bld (%)  Date Value  09/17/2021 6.1    LDL Cholesterol (Calc)  Date Value Ref Range Status  01/17/2020 214 (H) mg/dL (calc) Final    Comment:    LDL-C levels > or = 190 mg/dL may indicate familial  hypercholesterolemia (FH). Clinical assessment and  measurement of blood lipid levels should be  considered for all first degree relatives of  patients with an FH diagnosis.  For questions about testing for familial hypercholesterolemia, please call Engineer, materials at [email or web address]. Wardell Honour, et al. J National Lipid Association  Recommendations for Patient-Centered Management of  Dyslipidemia: Part 1 Journal of Clinical Lipidology  2015;9(2), 129-169. Reference range: <100 . Desirable range <100 mg/dL for primary prevention;   <70 mg/dL for patients with CHD or diabetic patients  with > or = 2 CHD risk factors. Marland Kitchen LDL-C is now calculated using the Martin-Hopkins  calculation, which is a validated novel method providing  better accuracy than the Friedewald equation in the  estimation of LDL-C.  Horald Pollen et al. Lenox Ahr. 4098;119(14): 2061-2068   (http://education.[facility].com/faq/FAQ164)    LDL Cholesterol  Date Value Ref Range Status  09/17/2021 112 (H) 0 - 99 mg/dL Final   Direct LDL  Date Value Ref Range Status  07/27/2020 217.0 mg/dL Final    Comment:    Optimal:  <100 mg/dLNear or Above Optimal:  100-129 mg/dLBorderline High:  130-159 mg/dLHigh:  160-189 mg/dLVery High:  >190 mg/dL      +-------+-----------+-----------+------------+------------+  ABI/TBIToday's ABIToday's TBIPrevious ABIPrevious TBI  +-------+-----------+-----------+------------+------------+  Right 1.01       0.45                                 +-------+-----------+-----------+------------+------------+  Left  1.04       0.7                                  +-------+-----------+-----------+------------+------------+   Rande Brunt. Lenell Antu, MD FACS [facility] Phone Number: [phone] 07/22/2022 3:28 PM   Total time spent on preparing this encounter including chart review, data review, collecting history, examining the patient, coordinating care for this new patient, 45 minutes.  Portions of this report may have been transcribed using voice recognition software.  Every effort has been made to ensure accuracy; however, inadvertent computerized transcription  errors may still be present.

## 2022-07-23 ENCOUNTER — Ambulatory Visit (INDEPENDENT_AMBULATORY_CARE_PROVIDER_SITE_OTHER): Payer: Medicare Other | Admitting: Family Medicine

## 2022-07-23 ENCOUNTER — Ambulatory Visit (INDEPENDENT_AMBULATORY_CARE_PROVIDER_SITE_OTHER): Payer: Medicare Other | Admitting: Vascular Surgery

## 2022-07-23 ENCOUNTER — Encounter: Payer: Self-pay | Admitting: Vascular Surgery

## 2022-07-23 VITALS — BP 130/70 | HR 64 | Temp 97.4°F | Ht 61.0 in | Wt 194.2 lb

## 2022-07-23 VITALS — BP 126/69 | HR 64 | Temp 97.9°F | Resp 20 | Ht 61.0 in | Wt 192.8 lb

## 2022-07-23 DIAGNOSIS — K219 Gastro-esophageal reflux disease without esophagitis: Secondary | ICD-10-CM

## 2022-07-23 DIAGNOSIS — Z780 Asymptomatic menopausal state: Secondary | ICD-10-CM

## 2022-07-23 DIAGNOSIS — R7303 Prediabetes: Secondary | ICD-10-CM

## 2022-07-23 DIAGNOSIS — E039 Hypothyroidism, unspecified: Secondary | ICD-10-CM

## 2022-07-23 DIAGNOSIS — I10 Essential (primary) hypertension: Secondary | ICD-10-CM | POA: Diagnosis not present

## 2022-07-23 DIAGNOSIS — I7300 Raynaud's syndrome without gangrene: Secondary | ICD-10-CM | POA: Diagnosis not present

## 2022-07-23 DIAGNOSIS — E559 Vitamin D deficiency, unspecified: Secondary | ICD-10-CM | POA: Diagnosis not present

## 2022-07-23 DIAGNOSIS — E782 Mixed hyperlipidemia: Secondary | ICD-10-CM | POA: Diagnosis not present

## 2022-07-23 DIAGNOSIS — F32A Depression, unspecified: Secondary | ICD-10-CM

## 2022-07-23 LAB — POCT GLYCOSYLATED HEMOGLOBIN (HGB A1C): Hemoglobin A1C: 6.2 % — AB (ref 4.0–5.6)

## 2022-07-23 MED ORDER — OMEPRAZOLE 40 MG PO CPDR: 40.0000 mg | DELAYED_RELEASE_CAPSULE | Freq: Every day | ORAL | 1 refills | Status: DC

## 2022-07-23 MED ORDER — LEVOTHYROXINE SODIUM 100 MCG PO TABS: 100.0000 ug | ORAL_TABLET | Freq: Every day | ORAL | 1 refills | Status: DC

## 2022-07-23 MED ORDER — SERTRALINE HCL 100 MG PO TABS: ORAL_TABLET | ORAL | 1 refills | Status: DC

## 2022-07-23 MED ORDER — VITAMIN D (ERGOCALCIFEROL) 1.25 MG (50000 UNIT) PO CAPS: 50000.0000 [IU] | ORAL_CAPSULE | ORAL | 1 refills | Status: DC

## 2022-07-23 NOTE — Progress Notes (Unsigned)
Established Patient Office Visit  Subjective   Patient ID: Samantha Burch, female    DOB: [birth year]  Age: 67 y.o. MRN: [id number]  Chief Complaint  Patient presents with   Medical Management of Chronic Issues    Pt is here for follow up.  HTN -- BP in office performed and is well controlled. She  reports no side effects to the medications, no chest pain, SOB, dizziness or headaches. She has a BP cuff at home and is checking BP regularly, reports they are in the normal range. Pt is taking her medication at nighttime, tolerating it well  Foot discoloration-- did go to see the vascular surgeon and he told her that she had Raynaud's phenomenon. She reports that her feet look much better and she is trying hard to keep her feet warm.  PreDM-- A1C performed in office today and is 6.2, slightly higher than previous. She reports she is still watching her diet and trying to reduce extra sugar.    Current Outpatient Medications  Medication Instructions   busPIRone (BUSPAR) 15 mg, Oral, 2 times daily   Cobalamin Combinations (B-12) 1000-400 MCG SUBL Sublingual, Daily   febuxostat (ULORIC) 40 mg, Oral, Daily   fexofenadine (ALLEGRA) 180 mg, Oral, Daily   levothyroxine (SYNTHROID) 100 mcg, Oral, Daily   omeprazole (PRILOSEC) 40 mg, Oral, Daily   pregabalin (LYRICA) 225 mg, Oral, 2 times daily   rosuvastatin (CRESTOR) 2.5 mg, Oral, Daily   sertraline (ZOLOFT) 100 MG tablet TAKE 1.5 TABLETS BY MOUTH EVERY DAY   telmisartan (MICARDIS) 20 mg, Oral, Daily at bedtime   tolterodine (DETROL) 2 mg, Oral, 2 times daily   Vitamin D (Ergocalciferol) (DRISDOL) 50,000 Units, Oral, Every 7 days    Patient Active Problem List   Diagnosis Date Noted   Primary hypertension 04/24/2022   Elevated blood pressure reading 01/25/2022   Prediabetes 01/24/2022   Vitamin D deficiency 04/26/2020   Hyperglycemia 04/26/2020   Hyperlipidemia 04/26/2020   B12 deficiency 01/05/2020   Overactive bladder  01/05/2020   Fatty liver 01/05/2020   Gout    Hypothyroidism    GERD (gastroesophageal reflux disease)    Idiopathic peripheral neuropathy 09/19/2016   Depression, recurrent ([place]) 09/19/2016   Anxiety 09/19/2016   Neurogenic bladder 09/19/2016      Review of Systems  All other systems reviewed and are negative.     Objective:     BP 130/70 (BP Location: Left Arm, Patient Position: Sitting, Cuff Size: Normal)   Pulse 64   Temp (!) 97.4 F (36.3 C) (Oral)   Ht 5\' 1"  (1.549 m)   Wt 194 lb 3.2 oz (88.1 kg)   LMP 05/06/2006 (Approximate)   SpO2 98%   BMI 36.69 kg/m    Physical Exam Vitals reviewed.  Constitutional:      Appearance: Normal appearance. She is well-groomed. She is obese.  Eyes:     Conjunctiva/sclera: Conjunctivae normal.  Cardiovascular:     Rate and Rhythm: Normal rate and regular rhythm.     Pulses: Normal pulses.          Dorsalis pedis pulses are 2+ on the right side and 2+ on the left side.     Heart sounds: S1 normal and S2 normal.  Pulmonary:     Effort: Pulmonary effort is normal.     Breath sounds: Normal breath sounds and air entry.  Abdominal:     General: Bowel sounds are normal.  Feet:     Comments: Skin still  appears dusky in the bilateral toes and forefeet, but there is no white blanching today. Neurological:     Mental Status: She is alert and oriented to person, place, and time. Mental status is at baseline.     Gait: Gait is intact.  Psychiatric:        Mood and Affect: Mood and affect normal.        Speech: Speech normal.        Behavior: Behavior normal.        Judgment: Judgment normal.      Results for orders placed or performed in visit on 07/23/22  POC HgB A1c  Result Value Ref Range   Hemoglobin A1C 6.2 (A) 4.0 - 5.6 %   HbA1c POC (<> result, manual entry)     HbA1c, POC (prediabetic range)     HbA1c, POC (controlled diabetic range)        The 10-year ASCVD risk score (Arnett DK, et al., 2019) is: 8.9%     Assessment & Plan:   Problem List Items Addressed This Visit       Unprioritized   Hypothyroidism    Needs new TSH and refills on her levothyroxine 100 mcg daily. Continue as prescribed      Relevant Medications   levothyroxine (SYNTHROID) 100 MCG tablet   Other Relevant Orders   TSH   GERD (gastroesophageal reflux disease)   Relevant Medications   omeprazole (PRILOSEC) 40 MG capsule   Vitamin D deficiency    Needs repeat level and DEXA scan ordered      Relevant Medications   Vitamin D, Ergocalciferol, (DRISDOL) 1.25 MG (50000 UNIT) CAPS capsule   Other Relevant Orders   Vitamin D, 25-hydroxy   Hyperlipidemia    Needs annual lipid panel today, continue crestor 2.5 mg daily.      Relevant Orders   Lipid Panel   Prediabetes - Primary (Chronic)    A1C is relatively stable, will continue to monitor this every 6 months       Relevant Orders   POC HgB A1c (Completed)   Primary hypertension    Current hypertension medications:       Sig   telmisartan (MICARDIS) 20 MG tablet Take 1 tablet (20 mg total) by mouth at bedtime.     Bp is controlled on the above medication, will continue as prescribed.        Other Visit Diagnoses     Postmenopausal state       Relevant Orders   DG Bone Density   Depression, unspecified depression type       Relevant Medications   sertraline (ZOLOFT) 100 MG tablet       Return in about 6 months (around 01/23/2023) for HTN.    Farrel Conners, MD

## 2022-07-24 NOTE — Assessment & Plan Note (Signed)
Needs annual lipid panel today, continue crestor 2.5 mg daily.

## 2022-07-24 NOTE — Assessment & Plan Note (Signed)
Needs new TSH and refills on her levothyroxine 100 mcg daily. Continue as prescribed

## 2022-07-24 NOTE — Assessment & Plan Note (Signed)
Needs repeat level and DEXA scan ordered

## 2022-07-24 NOTE — Assessment & Plan Note (Signed)
Current hypertension medications:       Sig   telmisartan (MICARDIS) 20 MG tablet Take 1 tablet (20 mg total) by mouth at bedtime.     Bp is controlled on the above medication, will continue as prescribed.

## 2022-07-24 NOTE — Assessment & Plan Note (Signed)
A1C is relatively stable, will continue to monitor this every 6 months

## 2022-08-27 DIAGNOSIS — M1A09X0 Idiopathic chronic gout, multiple sites, without tophus (tophi): Secondary | ICD-10-CM | POA: Diagnosis not present

## 2022-09-02 ENCOUNTER — Other Ambulatory Visit: Payer: Self-pay | Admitting: Neurology

## 2022-09-02 DIAGNOSIS — G609 Hereditary and idiopathic neuropathy, unspecified: Secondary | ICD-10-CM

## 2022-09-03 ENCOUNTER — Other Ambulatory Visit (INDEPENDENT_AMBULATORY_CARE_PROVIDER_SITE_OTHER): Payer: Medicare Other

## 2022-09-03 DIAGNOSIS — E559 Vitamin D deficiency, unspecified: Secondary | ICD-10-CM | POA: Diagnosis not present

## 2022-09-03 DIAGNOSIS — E039 Hypothyroidism, unspecified: Secondary | ICD-10-CM | POA: Diagnosis not present

## 2022-09-03 DIAGNOSIS — E782 Mixed hyperlipidemia: Secondary | ICD-10-CM

## 2022-09-03 LAB — LIPID PANEL
Cholesterol: 185 mg/dL (ref 0–200)
HDL: 44.1 mg/dL (ref >39.00)
NonHDL: 141.2
Total CHOL/HDL Ratio: 4
Triglycerides: 207 mg/dL — ABNORMAL HIGH (ref 0.0–149.0)
VLDL: 41.4 mg/dL — ABNORMAL HIGH (ref 0.0–40.0)

## 2022-09-03 LAB — TSH: TSH: 0.8 u[IU]/mL (ref 0.35–5.50)

## 2022-09-03 LAB — LDL CHOLESTEROL, DIRECT: Direct LDL: 120 mg/dL

## 2022-09-03 LAB — VITAMIN D 25 HYDROXY (VIT D DEFICIENCY, FRACTURES): VITD: 56.98 ng/mL (ref 30.00–100.00)

## 2022-09-13 ENCOUNTER — Other Ambulatory Visit: Payer: Self-pay | Admitting: *Deleted

## 2022-09-13 DIAGNOSIS — E782 Mixed hyperlipidemia: Secondary | ICD-10-CM

## 2022-09-13 MED ORDER — ROSUVASTATIN CALCIUM 5 MG PO TABS: 2.5000 mg | ORAL_TABLET | Freq: Every day | ORAL | 1 refills | Status: DC

## 2022-09-13 NOTE — Telephone Encounter (Signed)
Rx done. 

## 2022-10-16 ENCOUNTER — Encounter: Payer: Self-pay | Admitting: Family Medicine

## 2022-10-17 ENCOUNTER — Other Ambulatory Visit: Payer: Self-pay | Admitting: Family Medicine

## 2022-10-17 DIAGNOSIS — I10 Essential (primary) hypertension: Secondary | ICD-10-CM

## 2022-10-21 DIAGNOSIS — Z23 Encounter for immunization: Secondary | ICD-10-CM | POA: Diagnosis not present

## 2022-11-27 DIAGNOSIS — R922 Inconclusive mammogram: Secondary | ICD-10-CM | POA: Diagnosis not present

## 2022-11-27 DIAGNOSIS — R921 Mammographic calcification found on diagnostic imaging of breast: Secondary | ICD-10-CM | POA: Diagnosis not present

## 2022-11-27 DIAGNOSIS — E349 Endocrine disorder, unspecified: Secondary | ICD-10-CM | POA: Diagnosis not present

## 2022-11-27 DIAGNOSIS — M8588 Other specified disorders of bone density and structure, other site: Secondary | ICD-10-CM | POA: Diagnosis not present

## 2022-11-27 LAB — HM DEXA SCAN

## 2022-12-19 ENCOUNTER — Encounter (INDEPENDENT_AMBULATORY_CARE_PROVIDER_SITE_OTHER): Payer: Self-pay

## 2023-01-10 ENCOUNTER — Other Ambulatory Visit: Payer: Self-pay | Admitting: Family Medicine

## 2023-01-10 DIAGNOSIS — E559 Vitamin D deficiency, unspecified: Secondary | ICD-10-CM

## 2023-01-15 DIAGNOSIS — R7989 Other specified abnormal findings of blood chemistry: Secondary | ICD-10-CM | POA: Diagnosis not present

## 2023-01-15 DIAGNOSIS — M1A09X0 Idiopathic chronic gout, multiple sites, without tophus (tophi): Secondary | ICD-10-CM | POA: Diagnosis not present

## 2023-01-15 DIAGNOSIS — E669 Obesity, unspecified: Secondary | ICD-10-CM | POA: Diagnosis not present

## 2023-01-15 DIAGNOSIS — R2231 Localized swelling, mass and lump, right upper limb: Secondary | ICD-10-CM | POA: Diagnosis not present

## 2023-01-15 DIAGNOSIS — Z6837 Body mass index (BMI) 37.0-37.9, adult: Secondary | ICD-10-CM | POA: Diagnosis not present

## 2023-01-23 ENCOUNTER — Other Ambulatory Visit: Payer: Self-pay | Admitting: Family Medicine

## 2023-01-23 DIAGNOSIS — E782 Mixed hyperlipidemia: Secondary | ICD-10-CM

## 2023-01-26 ENCOUNTER — Other Ambulatory Visit: Payer: Self-pay | Admitting: Family Medicine

## 2023-01-26 DIAGNOSIS — I10 Essential (primary) hypertension: Secondary | ICD-10-CM

## 2023-01-26 DIAGNOSIS — K219 Gastro-esophageal reflux disease without esophagitis: Secondary | ICD-10-CM

## 2023-02-02 ENCOUNTER — Other Ambulatory Visit: Payer: Self-pay | Admitting: Family Medicine

## 2023-02-02 DIAGNOSIS — F419 Anxiety disorder, unspecified: Secondary | ICD-10-CM

## 2023-02-02 DIAGNOSIS — F32A Depression, unspecified: Secondary | ICD-10-CM

## 2023-02-07 ENCOUNTER — Ambulatory Visit (INDEPENDENT_AMBULATORY_CARE_PROVIDER_SITE_OTHER): Payer: Medicare Other | Admitting: Neurology

## 2023-02-07 ENCOUNTER — Encounter: Payer: Self-pay | Admitting: Neurology

## 2023-02-07 VITALS — BP 128/77 | HR 78 | Ht 61.5 in | Wt 197.8 lb

## 2023-02-07 DIAGNOSIS — G609 Hereditary and idiopathic neuropathy, unspecified: Secondary | ICD-10-CM

## 2023-02-07 DIAGNOSIS — M545 Low back pain, unspecified: Secondary | ICD-10-CM | POA: Diagnosis not present

## 2023-02-07 DIAGNOSIS — G8929 Other chronic pain: Secondary | ICD-10-CM

## 2023-02-07 MED ORDER — PREGABALIN 225 MG PO CAPS
225.0000 mg | ORAL_CAPSULE | Freq: Two times a day (BID) | ORAL | 3 refills | Status: DC
Start: 2023-02-07 — End: 2023-10-02

## 2023-02-07 NOTE — Progress Notes (Signed)
NEUROLOGY FOLLOW UP OFFICE NOTE  Samantha Burch [id number] 28-Apr-1956  HISTORY OF PRESENT ILLNESS: I had the pleasure of seeing Samantha Burch in follow-up in the neurology clinic on 02/07/2023.  The patient was last seen a year ago for peripheral polyneuropathy. She is alone in the office today. Records and images were personally reviewed where available.  Since her last visit, she reports neuropathy continues to remain stable on Lyrica 225mg  BID, no side effects. She has tingling, burning, and numbness in her feet/calves with good response to Lyrica. She started having purplish discoloration of the toes in both fee and went to the ER in 07/2022. She saw Vascular surgery with reassuring non-invasive testing, diagnosed with Reynaud's phenomenon. She denies any pain. She continues to have balance changes, no falls. She felt physical therapy a year ago was helpful, however due to transportation issues, she was unable to continue PT. She continues to have back pain, now radiating around her ribs, worse when she washes dishes or her hair. No neck pain, bowel/bladder dysfunction.    History on Initial Assessment 09/19/2016: This is a very pleasant 67 year old right-handed woman with a history of hypothyroidism, neurogenic bladder, depression, anxiety, presenting to establish care for peripheral polyneuropathy. She recently moved to [place] from [place] with her son. She had been seeing neurologist Dr. Cathey Endow for the neuropathy. 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 Symptoms worsened over time with significant pain in both legs described as pain inside her legs like she wanted to scratch them all the time. She described pins and needles sensation, sometimes affecting her arms as well. She was initially started on gabapentin but it stopped working, and has been taking Lyrica for the past 10 years. She is taking Lyrica 225mg   BID without side effects. She reports symptom control is much better but not perfect, there are days the neuropathy bothers her, more when she is inactive. She has found that moving around helps relieve the symptoms. She fell last year and broke her left foot, and since then has tended to veer when she gets up. She denies any back pain, but reports pain in the right hip region radiating down the back of her right leg. She tells me her neurologist told her she has unusually brisk reflexes for the neuropathy, per records she has had an MRI brain, cervical, thoracic spine that were unremarkable. NCS reported as normal. Her B12 level was low and she has been on monthly B12 injections. She has a history of vertigo that significantly improved with vestibular therapy, she was told she has gaze instability, and has intermittent vertigo with positional changes. She denies any headaches, tinnitus, diplopia, dysarthria/dysphagia, neck pain, bowel/bladder dysfunction. She has a history of neurogenic bladder on Detrol, low potassium on daily potassium supplements, and depression/anxiety on Zoloft. She reports a family history of peripheral polyneuropathy in her father and paternal grandfather.  PAST MEDICAL HISTORY: Past Medical History:  Diagnosis Date   Anxiety    Depression    GERD (gastroesophageal reflux disease)    Gout    Hyperlipidemia    Hypertension    Hypoglycemia    Hypothyroidism    Neurogenic bladder    Neuropathy    Urinary incontinence     MEDICATIONS: Current Outpatient Medications on File Prior to Visit  Medication Sig Dispense Refill   busPIRone (BUSPAR) 15 MG tablet Take 1 tablet (15 mg  total) by mouth 2 (two) times daily. 180 tablet 3   Cobalamin Combinations (B-12) 1000-400 MCG SUBL Place under the tongue daily.     febuxostat (ULORIC) 40 MG tablet Take 40 mg by mouth daily.     fexofenadine (ALLEGRA) 180 MG tablet Take 1 tablet (180 mg total) by mouth daily. 90 tablet 1    levothyroxine (SYNTHROID) 100 MCG tablet Take 1 tablet (100 mcg total) by mouth daily. 90 tablet 1   omeprazole (PRILOSEC) 40 MG capsule TAKE 1 CAPSULE (40 MG TOTAL) BY MOUTH DAILY. 90 capsule 0   pregabalin (LYRICA) 225 MG capsule TAKE 1 CAPSULE BY MOUTH 2 TIMES DAILY. 180 capsule 3   rosuvastatin (CRESTOR) 5 MG tablet TAKE 1/2 TABLET BY MOUTH DAILY 45 tablet 0   sertraline (ZOLOFT) 100 MG tablet TAKE 1.5 TABLETS BY MOUTH EVERY DAY 135 tablet 1   telmisartan (MICARDIS) 20 MG tablet TAKE 1 TABLET BY MOUTH EVERYDAY AT BEDTIME 90 tablet 0   tolterodine (DETROL) 2 MG tablet Take 1 tablet (2 mg total) by mouth 2 (two) times daily. 180 tablet 3   Vitamin D, Ergocalciferol, (DRISDOL) 1.25 MG (50000 UNIT) CAPS capsule TAKE 1 CAPSULE (50,000 UNITS TOTAL) BY MOUTH EVERY 7 (SEVEN) DAYS 12 capsule 1   No current facility-administered medications on file prior to visit.    ALLERGIES: Allergies  Allergen Reactions   Elemental Sulfur    Lipitor [Atorvastatin]     Cramping muscles   Pravastatin     cramping   Sulfa Antibiotics     FAMILY HISTORY: Family History  Problem Relation Age of Onset   Ovarian cancer Mother    Alcohol abuse Father    Hypertension Father    Diabetes type II Father    Neuropathy Father    Neuropathy Paternal Grandmother    Colon cancer Maternal Uncle     SOCIAL HISTORY: Social History   Socioeconomic History   Marital status: Divorced    Spouse name: Not on file   Number of children: Not on file   Years of education: Not on file   Highest education level: Not on file  Occupational History   Occupation: Disabled  Tobacco Use   Smoking status: Former    Current packs/day: 0.00    Average packs/day: 1 pack/day for 30.0 years (30.0 ttl pk-yrs)    Types: Cigarettes    Start date: 05/06/1981    Quit date: 05/07/2011    Years since quitting: 11.7   Smokeless tobacco: Never  Vaping Use   Vaping status: Never Used  Substance and Sexual Activity   Alcohol use: No    Drug use: No   Sexual activity: Never  Other Topics Concern   Not on file  Social History Narrative   Right handed      Highest level of edu- 12th grade      Son lives with pt ( age 31)      Social Determinants of Health   Financial Resource Strain: Low Risk  (01/24/2022)   Overall Financial Resource Strain (CARDIA)    Difficulty of Paying Living Expenses: Not hard at all  Food Insecurity: No Food Insecurity (01/24/2022)   Hunger Vital Sign    Worried About Running Out of Food in the Last Year: Never true    Ran Out of Food in the Last Year: Never true  Transportation Needs: No Transportation Needs (01/24/2022)   PRAPARE - Transportation    Lack of Transportation (Medical): No    Lack  of Transportation (Non-Medical): No  Physical Activity: Inactive (01/24/2022)   Exercise Vital Sign    Days of Exercise per Week: 0 days    Minutes of Exercise per Session: 0 min  Stress: No Stress Concern Present (01/24/2022)   [employer] of Occupational Health - Occupational Stress Questionnaire    Feeling of Stress : Not at all  Social Connections: Socially Isolated (01/24/2022)   Social Connection and Isolation Panel [NHANES]    Frequency of Communication with Friends and Family: More than three times a week    Frequency of Social Gatherings with Friends and Family: More than three times a week    Attends Religious Services: Never    Database administrator or Organizations: No    Attends Banker Meetings: Never    Marital Status: Divorced  Catering manager Violence: Not At Risk (01/24/2022)   Humiliation, Afraid, Rape, and Kick questionnaire    Fear of Current or Ex-Partner: No    Emotionally Abused: No    Physically Abused: No    Sexually Abused: No     PHYSICAL EXAM: Vitals:   02/07/23 1336  BP: 128/77  Pulse: 78  SpO2: 99%   General: No acute distress Head:  Normocephalic/atraumatic Skin/Extremities: No rash, no edema Neurological Exam: alert and awake.  No aphasia or dysarthria. Fund of knowledge is appropriate. Attention and concentration are normal.   Cranial nerves: Pupils equal, round. Extraocular movements intact with no nystagmus. Visual fields full.  No facial asymmetry.  Motor: Bulk and tone normal, muscle strength 5/5 throughout with no pronator drift. Sensation intact to all modalities on both UE, decreased cold and vibration sense to ankles bilaterally, intact to pin. Reflexes brisk +3 throughout with hyperactive pectoralis reflex. No Hoffman sign or clonus noted today. Toes down.  Finger to nose testing intact.  Gait narrow-based and steady.  Romberg slight sway.    IMPRESSION: This is a very pleasant 67 yo RH woman with a history of hypothyroidism, neurogenic bladder, depression, anxiety, with peripheral polyneuropathy. Neuropathy remains stable with good response to Lyrica 225mg  BID, refills sent. She continues to note balance issues from neuropathy as well as back pain, resume Physical therapy. She will be referred to Ortho for low back pain. She is noted to have hyperreflexia but no weakness, no neck pain. Follow-up in 1 year, call for any changes.    Thank you for allowing me to participate in her care.  Please do not hesitate to call for any questions or concerns.   Patrcia Dolly, M.D.   CC: Dr. Casimiro Needle

## 2023-02-07 NOTE — Patient Instructions (Signed)
Always a pleasure to see you.  Continue Lyrica 225mg  twice a day  2. Referral will be sent for Physical therapy for balance and back pain  3. Referral will be sent to Ortho for back pain  4. Follow-up in 1 year, call for any changes

## 2023-02-14 ENCOUNTER — Other Ambulatory Visit: Payer: Self-pay | Admitting: Family Medicine

## 2023-02-14 DIAGNOSIS — J3089 Other allergic rhinitis: Secondary | ICD-10-CM

## 2023-02-24 DIAGNOSIS — Z23 Encounter for immunization: Secondary | ICD-10-CM | POA: Diagnosis not present

## 2023-03-05 ENCOUNTER — Telehealth: Payer: Self-pay | Admitting: *Deleted

## 2023-03-05 DIAGNOSIS — F32A Depression, unspecified: Secondary | ICD-10-CM

## 2023-03-05 DIAGNOSIS — F419 Anxiety disorder, unspecified: Secondary | ICD-10-CM

## 2023-03-05 MED ORDER — BUSPIRONE HCL 15 MG PO TABS: 15.0000 mg | ORAL_TABLET | Freq: Two times a day (BID) | ORAL | 0 refills | Status: DC

## 2023-03-05 MED ORDER — SERTRALINE HCL 100 MG PO TABS: ORAL_TABLET | ORAL | 0 refills | Status: DC

## 2023-03-05 NOTE — Telephone Encounter (Signed)
Rx done. 

## 2023-03-06 IMAGING — CT CT RENAL STONE PROTOCOL
2 of 4 series · 17 of 46 positions shown, 19 images · non-contrast
Comparison: None.

CLINICAL DATA: Bilateral flank pain for 2 weeks.

EXAM:
CT ABDOMEN AND PELVIS WITHOUT CONTRAST
TECHNIQUE: Multidetector CT imaging of the abdomen and pelvis was performed
following the standard protocol without IV contrast.

[Series 2: stone full · axial · 0.76mm/px · z∈[-909,-489]mm · 14 of 94 slices shown, 16 images]
[im 5/94  soft-tissue]
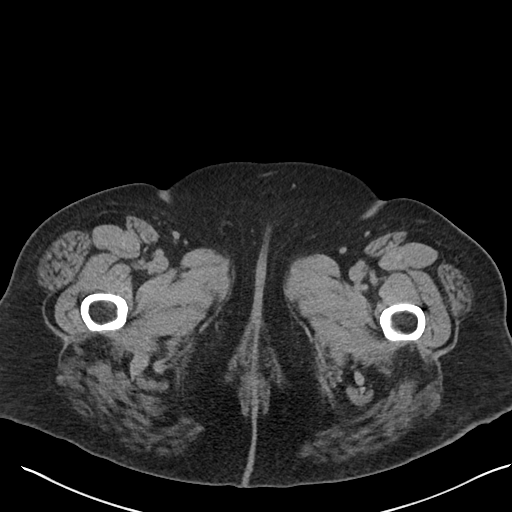
[im 5/94  bone]
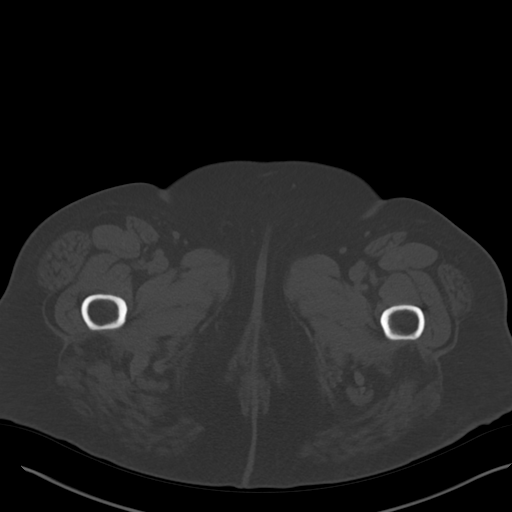
[im 13/94  soft-tissue]
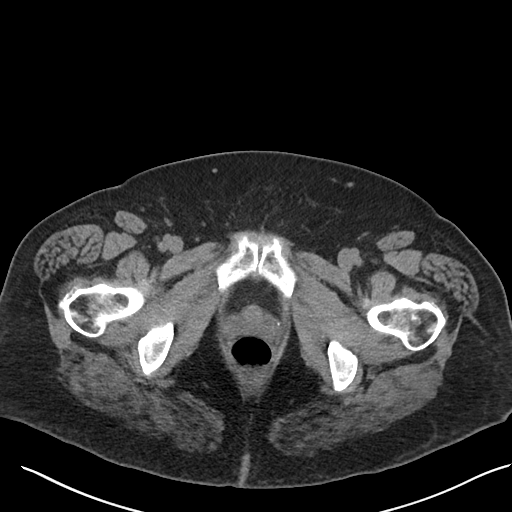
[im 17/94  soft-tissue]
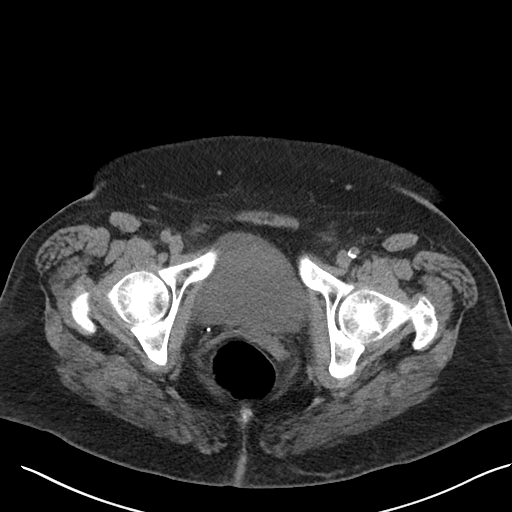
[im 25/94  soft-tissue]
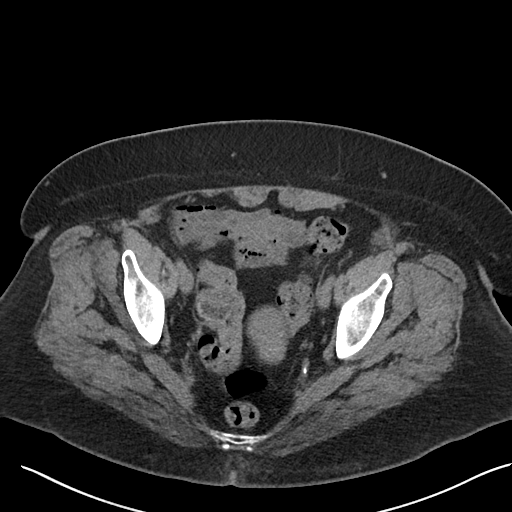
[im 33/94  soft-tissue]
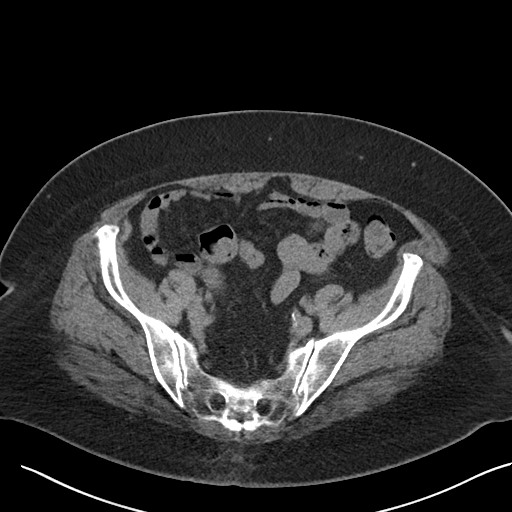
[im 37/94  soft-tissue]
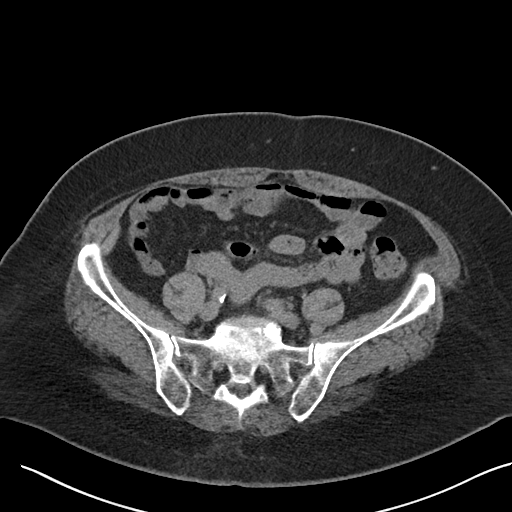
[im 45/94  soft-tissue]
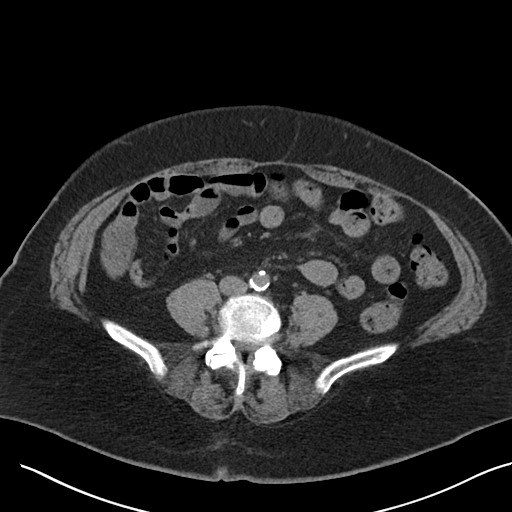
[im 49/94  soft-tissue]
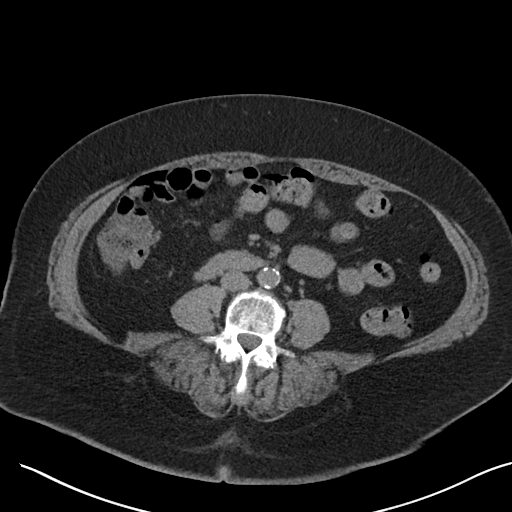
[im 57/94  soft-tissue]
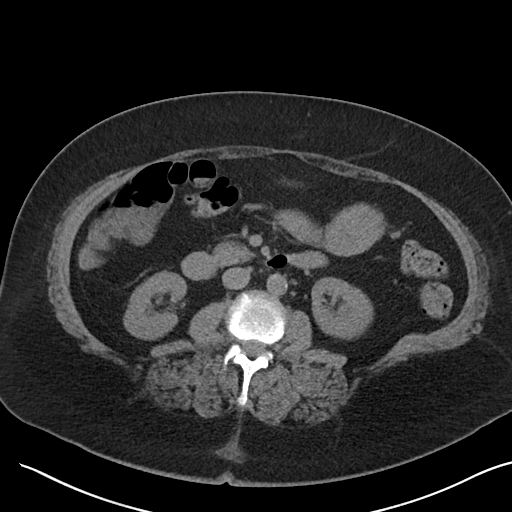
[im 57/94  bone]
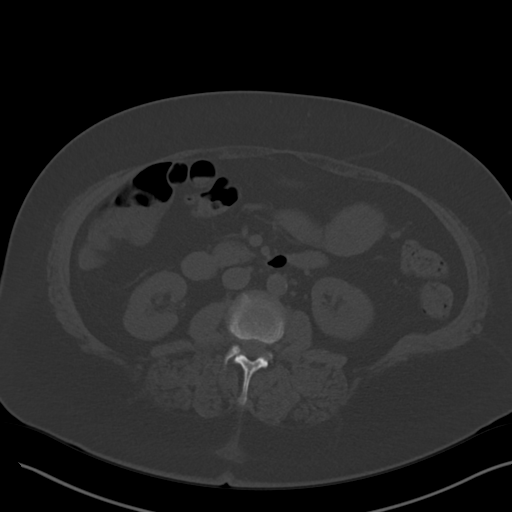
[im 61/94  soft-tissue]
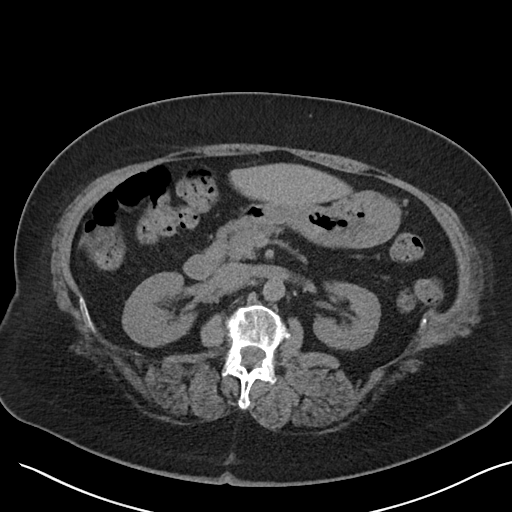
[im 69/94  soft-tissue]
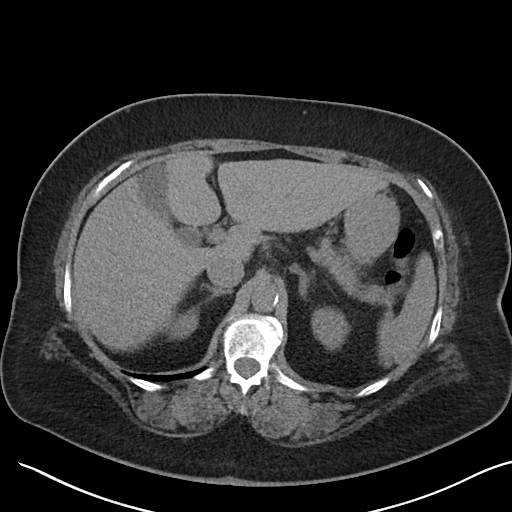
[im 77/94  soft-tissue]
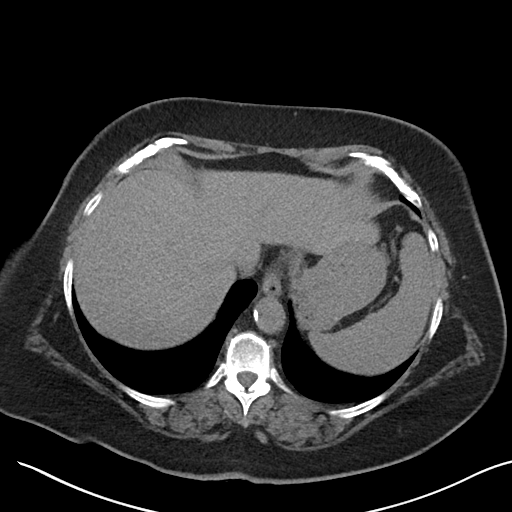
[im 81/94  soft-tissue]
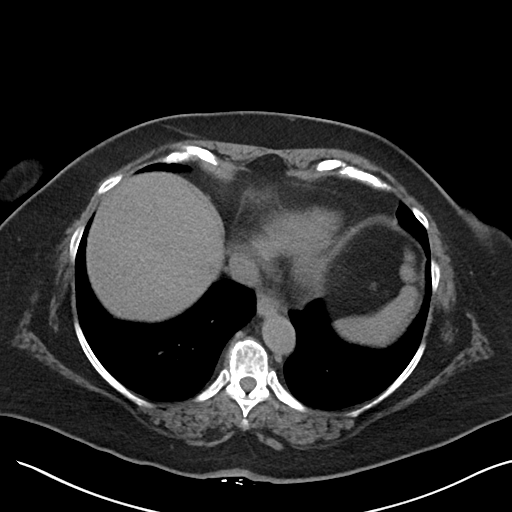
[im 89/94  soft-tissue]
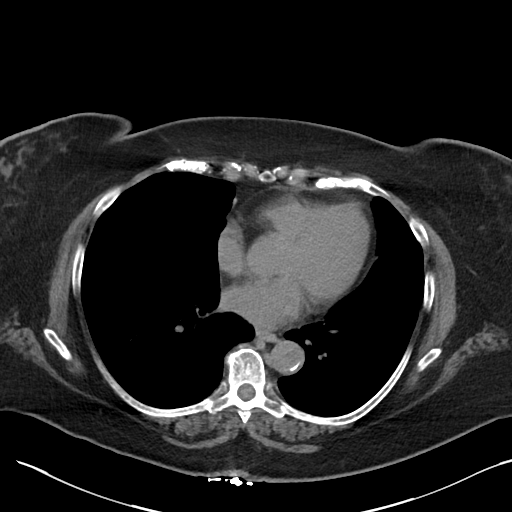

[Series 5: coronal · coronal · 0.77mm/px · 3 of 95 slices shown]
[im 32/95  soft-tissue]
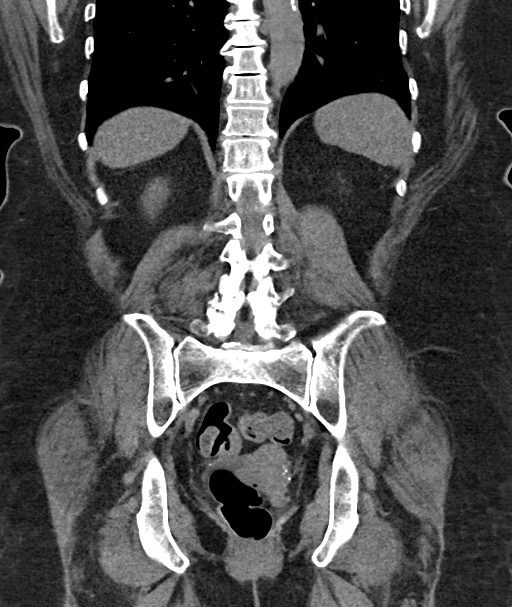
[im 42/95  soft-tissue]
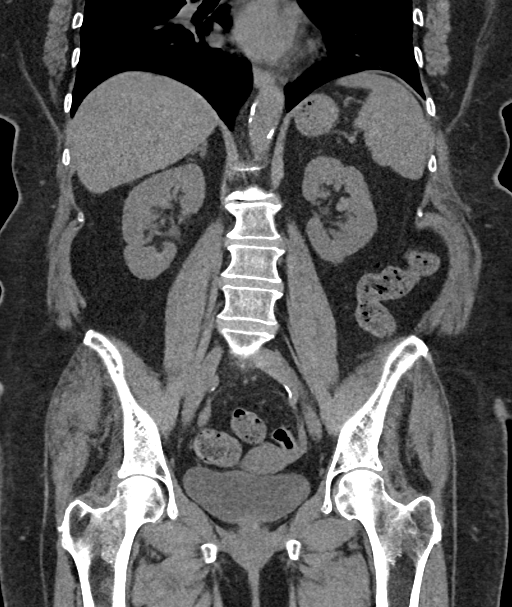
[im 53/95  soft-tissue]
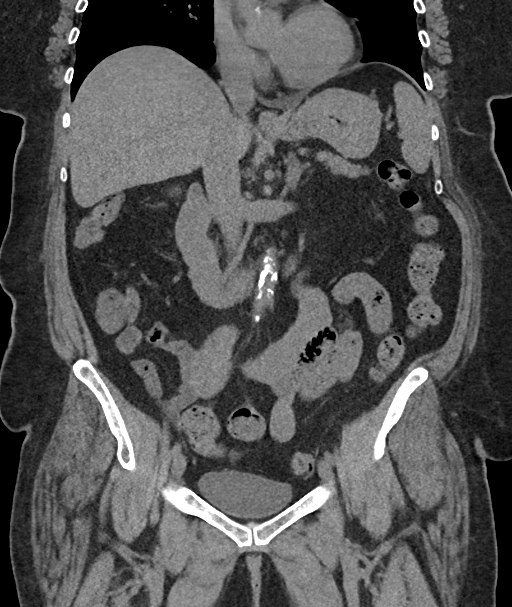

[17 of 46 positions shown; findings below may reference images not displayed]

FINDINGS: Lower chest: No acute abnormality.

Hepatobiliary: No focal liver abnormality is seen. No gallstones,
gallbladder wall thickening, or biliary dilatation.

Pancreas: Unremarkable. No pancreatic ductal dilatation or
surrounding inflammatory changes.

Spleen: Normal in size without focal abnormality.

Adrenals/Urinary Tract: Adrenal glands are unremarkable. Kidneys are
normal, without renal calculi, focal lesion, or hydronephrosis.
Bladder is unremarkable.

Stomach/Bowel: Stomach is within normal limits. Appendix appears
normal. No evidence of bowel wall thickening, distention, or
inflammatory changes.

Vascular/Lymphatic: Aortic atherosclerosis. No enlarged abdominal or
pelvic lymph nodes.

Reproductive: Uterus and bilateral adnexa are unremarkable.

Other: No abdominal wall hernia or abnormality. No abdominopelvic
ascites.

Musculoskeletal: Degenerative changes affect the spine.
IMPRESSION: 1. No evidence for urinary tract calculus or hydronephrosis.

## 2023-03-07 ENCOUNTER — Telehealth: Payer: Self-pay | Admitting: *Deleted

## 2023-03-07 DIAGNOSIS — J3089 Other allergic rhinitis: Secondary | ICD-10-CM

## 2023-03-07 MED ORDER — FEXOFENADINE HCL 180 MG PO TABS
180.0000 mg | ORAL_TABLET | Freq: Every day | ORAL | 0 refills | Status: DC
Start: 2023-03-07 — End: 2023-03-13

## 2023-03-07 NOTE — Telephone Encounter (Signed)
Rx done. 

## 2023-03-13 ENCOUNTER — Ambulatory Visit: Payer: Medicare Other | Admitting: Family Medicine

## 2023-03-13 ENCOUNTER — Encounter: Payer: Self-pay | Admitting: Family Medicine

## 2023-03-13 VITALS — BP 122/68 | HR 70 | Temp 97.9°F

## 2023-03-13 DIAGNOSIS — J3089 Other allergic rhinitis: Secondary | ICD-10-CM | POA: Diagnosis not present

## 2023-03-13 DIAGNOSIS — F419 Anxiety disorder, unspecified: Secondary | ICD-10-CM

## 2023-03-13 DIAGNOSIS — L219 Seborrheic dermatitis, unspecified: Secondary | ICD-10-CM

## 2023-03-13 DIAGNOSIS — I10 Essential (primary) hypertension: Secondary | ICD-10-CM

## 2023-03-13 DIAGNOSIS — R7303 Prediabetes: Secondary | ICD-10-CM

## 2023-03-13 DIAGNOSIS — F32A Depression, unspecified: Secondary | ICD-10-CM

## 2023-03-13 DIAGNOSIS — N3281 Overactive bladder: Secondary | ICD-10-CM

## 2023-03-13 DIAGNOSIS — E782 Mixed hyperlipidemia: Secondary | ICD-10-CM | POA: Diagnosis not present

## 2023-03-13 DIAGNOSIS — K219 Gastro-esophageal reflux disease without esophagitis: Secondary | ICD-10-CM | POA: Diagnosis not present

## 2023-03-13 DIAGNOSIS — E039 Hypothyroidism, unspecified: Secondary | ICD-10-CM

## 2023-03-13 DIAGNOSIS — E66811 Obesity, class 1: Secondary | ICD-10-CM | POA: Diagnosis not present

## 2023-03-13 LAB — POCT GLYCOSYLATED HEMOGLOBIN (HGB A1C): Hemoglobin A1C: 6 % — AB (ref 4.0–5.6)

## 2023-03-13 MED ORDER — KETOCONAZOLE 2 % EX SHAM: 1.0000 | MEDICATED_SHAMPOO | CUTANEOUS | 5 refills | Status: DC

## 2023-03-13 MED ORDER — TELMISARTAN 20 MG PO TABS: 20.0000 mg | ORAL_TABLET | Freq: Every day | ORAL | 1 refills | Status: DC

## 2023-03-13 MED ORDER — PHENTERMINE HCL 15 MG PO CAPS: 15.0000 mg | ORAL_CAPSULE | ORAL | 0 refills | Status: DC

## 2023-03-13 MED ORDER — OMEPRAZOLE 40 MG PO CPDR: 40.0000 mg | DELAYED_RELEASE_CAPSULE | Freq: Every day | ORAL | 1 refills | Status: DC

## 2023-03-13 MED ORDER — FEXOFENADINE HCL 180 MG PO TABS: 180.0000 mg | ORAL_TABLET | Freq: Every day | ORAL | 1 refills | Status: AC

## 2023-03-13 MED ORDER — TOLTERODINE TARTRATE 2 MG PO TABS: 2.0000 mg | ORAL_TABLET | Freq: Two times a day (BID) | ORAL | 3 refills | Status: DC

## 2023-03-13 MED ORDER — SERTRALINE HCL 100 MG PO TABS: ORAL_TABLET | ORAL | 1 refills | Status: DC

## 2023-03-13 MED ORDER — LEVOTHYROXINE SODIUM 100 MCG PO TABS: 100.0000 ug | ORAL_TABLET | Freq: Every day | ORAL | 1 refills | Status: DC

## 2023-03-13 MED ORDER — BUSPIRONE HCL 15 MG PO TABS: 15.0000 mg | ORAL_TABLET | Freq: Two times a day (BID) | ORAL | 1 refills | Status: DC

## 2023-03-13 MED ORDER — ROSUVASTATIN CALCIUM 5 MG PO TABS: 2.5000 mg | ORAL_TABLET | Freq: Every day | ORAL | 1 refills | Status: DC

## 2023-03-13 NOTE — Progress Notes (Signed)
Established Patient Office Visit  Subjective   Patient ID: Samantha Burch, female    DOB: [birth year]  Age: 67 y.o. MRN: [id number]  Chief Complaint  Patient presents with   Medical Management of Chronic Issues    Pt is here for follow up today.   HTN -- BP in office performed and is well controlled. She  reports no side effects to the medications, no chest pain, SOB, dizziness or headaches. She has a BP cuff at home and is checking BP regularly, reports they are in the normal range.   Pt is reporting itchy and flaky scalp that has been going on for a very long time. States she uses OTC shampoos but they do not seem to work that well.   Pt reports that she is having more mid back pain recently. States that anytime she uses her arms or leans forward to wash dishes she starts having this mid back pain. States that she has been referred to [facility] and will be starting physical therapy.   Patient reports that she is interested in losing weight. States that she has been overweight for a very long time. States she feels that her back pain would improve with losing weight. Has tried weight watchers and other forms of dieting in the past, states that she does try to get physical activity but is somewhat limited due to joint and back pain.      Current Outpatient Medications  Medication Instructions   busPIRone (BUSPAR) 15 mg, Oral, 2 times daily   Cobalamin Combinations (B-12) 1000-400 MCG SUBL Sublingual, Daily   febuxostat (ULORIC) 40 mg, Oral, Daily   fexofenadine (ALLEGRA) 180 mg, Oral, Daily   ketoconazole (NIZORAL) 2 % shampoo 1 Application, Topical, 2 times weekly   levothyroxine (SYNTHROID) 100 mcg, Oral, Daily   omeprazole (PRILOSEC) 40 mg, Oral, Daily   phentermine 15 mg, Oral, BH-each morning   [START ON 04/10/2023] phentermine 15 mg, Oral, BH-each morning   [START ON 05/09/2023] phentermine 15 mg, Oral, BH-each morning   pregabalin (LYRICA) 225 mg, Oral, 2 times daily    rosuvastatin (CRESTOR) 2.5 mg, Oral, Daily   sertraline (ZOLOFT) 100 MG tablet TAKE 1 AND 1/2 TABLETS DAILY BY MOUTH   telmisartan (MICARDIS) 20 mg, Oral, Daily at bedtime   tolterodine (DETROL) 2 mg, Oral, 2 times daily   Vitamin D (Ergocalciferol) (DRISDOL) 50,000 Units, Oral, Every 7 days    Patient Active Problem List   Diagnosis Date Noted   Obesity (BMI 30.0-34.9) 03/14/2023   Primary hypertension 04/24/2022   Prediabetes 01/24/2022   Vitamin D deficiency 04/26/2020   Hyperlipidemia 04/26/2020   B12 deficiency 01/05/2020   Overactive bladder 01/05/2020   Fatty liver 01/05/2020   Gout    Hypothyroidism    GERD (gastroesophageal reflux disease)    Idiopathic peripheral neuropathy 09/19/2016   Depression, recurrent (HCC) 09/19/2016   Anxiety 09/19/2016   Neurogenic bladder 09/19/2016      Review of Systems  All other systems reviewed and are negative.     Objective:     BP 122/68 (BP Location: Left Arm, Patient Position: Sitting, Cuff Size: Large)   Pulse 70   Temp 97.9 F (36.6 C) (Oral)   LMP 05/06/2006 (Approximate)   SpO2 99%    Physical Exam Vitals reviewed.  Constitutional:      Appearance: Normal appearance. She is well-groomed. She is obese.  Eyes:     Conjunctiva/sclera: Conjunctivae normal.  Neck:     Thyroid:  No thyromegaly.  Cardiovascular:     Rate and Rhythm: Normal rate and regular rhythm.     Pulses: Normal pulses.     Heart sounds: S1 normal and S2 normal.  Pulmonary:     Effort: Pulmonary effort is normal.     Breath sounds: Normal breath sounds and air entry.  Abdominal:     General: Bowel sounds are normal.  Musculoskeletal:     Right lower leg: No edema.     Left lower leg: No edema.  Neurological:     Mental Status: She is alert and oriented to person, place, and time. Mental status is at baseline.     Gait: Gait is intact.  Psychiatric:        Mood and Affect: Mood and affect normal.        Speech: Speech normal.         Behavior: Behavior normal.        Judgment: Judgment normal.      Results for orders placed or performed in visit on 03/13/23  POC HgB A1c  Result Value Ref Range   Hemoglobin A1C 6.0 (A) 4.0 - 5.6 %   HbA1c POC (<> result, manual entry)     HbA1c, POC (prediabetic range)     HbA1c, POC (controlled diabetic range)        The 10-year ASCVD risk score (Arnett DK, et al., 2019) is: 7.9%    Assessment & Plan:  Prediabetes Assessment & Plan: A1C is 6.0, stable from previous, will continue to monitor every 6 months  Orders: -     POCT glycosylated hemoglobin (Hb A1C)  Anxiety -     busPIRone HCl; Take 1 tablet (15 mg total) by mouth 2 (two) times daily.  Dispense: 180 tablet; Refill: 1  Acquired hypothyroidism -     Levothyroxine Sodium; Take 1 tablet (100 mcg total) by mouth daily.  Dispense: 90 tablet; Refill: 1  Overactive bladder -     Tolterodine Tartrate; Take 1 tablet (2 mg total) by mouth 2 (two) times daily.  Dispense: 180 tablet; Refill: 3  Environmental and seasonal allergies -     Fexofenadine HCl; Take 1 tablet (180 mg total) by mouth daily.  Dispense: 90 tablet; Refill: 1  Gastroesophageal reflux disease, unspecified whether esophagitis present Assessment & Plan: SX well controlled per the patient's report, she would like to conitnue her 40 mg prilosec daily. Refills written.  Orders: -     Omeprazole; Take 1 capsule (40 mg total) by mouth daily.  Dispense: 90 capsule; Refill: 1  Mixed hyperlipidemia Assessment & Plan: Reviewed last lipid panel.  continue crestor 2.5 mg daily.  Orders: -     Rosuvastatin Calcium; Take 0.5 tablets (2.5 mg total) by mouth daily.  Dispense: 45 tablet; Refill: 1  Depression, unspecified depression type -     Sertraline HCl; TAKE 1 AND 1/2 TABLETS DAILY BY MOUTH  Dispense: 135 tablet; Refill: 1  Primary hypertension Assessment & Plan: Current hypertension medications:       Sig   telmisartan (MICARDIS) 20 MG tablet  Take 1 tablet (20 mg total) by mouth at bedtime.     Bp is controlled on the above medication, will continue as prescribed.    Orders: -     Telmisartan; Take 1 tablet (20 mg total) by mouth at bedtime.  Dispense: 90 tablet; Refill: 1  Seborrheic dermatitis of scalp -     Ketoconazole; Apply 1 Application topically 2 (two) times a  week.  Dispense: 120 mL; Refill: 5  Obesity (BMI 30.0-34.9) Assessment & Plan: I have had an extensive 30 minute conversation today with the patient about healthy eating habits, exercise, calorie and carb goals for sustainable and successful weight loss. I gave the patient caloric and protein daily intake values as well as described the importance of increasing fiber and water intake. I discussed weight loss medications that could be used in the treatment of this patient. Handouts on low carb eating were given to the patient.    I think that she would tolerate taking phentermine for appetite suppression. I discussed the possible side effects of the medication and pt is willing to try it. We will need to monitor her BP carefully at each visit. I have sent in rx's for this and gave the patient calorie, carb, and protein goals. RTC in 3 months   Orders: -     Phentermine HCl; Take 1 capsule (15 mg total) by mouth every morning.  Dispense: 30 capsule; Refill: 0 -     Phentermine HCl; Take 1 capsule (15 mg total) by mouth every morning.  Dispense: 30 capsule; Refill: 0 -     Phentermine HCl; Take 1 capsule (15 mg total) by mouth every morning.  Dispense: 30 capsule; Refill: 0     Return in about 3 months (around 06/13/2023) for weight loss.    Karie Georges, MD

## 2023-03-13 NOTE — Patient Instructions (Addendum)
Oscal with D supplement  Selenium sulfide shampoo (selsun blue clinical strength) or Nizoral (ketoconazole) shampoo  Total calories: 1500 per day  Carbs-- LESS than 100 grams  Protein-- at or above 100 grams

## 2023-03-14 DIAGNOSIS — E66811 Obesity, class 1: Secondary | ICD-10-CM | POA: Insufficient documentation

## 2023-03-14 NOTE — Assessment & Plan Note (Signed)
SX well controlled per the patient's report, she would like to conitnue her 40 mg prilosec daily. Refills written.

## 2023-03-14 NOTE — Assessment & Plan Note (Signed)
A1C is 6.0, stable from previous, will continue to monitor every 6 months

## 2023-03-14 NOTE — Assessment & Plan Note (Signed)
Current hypertension medications:       Sig   telmisartan (MICARDIS) 20 MG tablet Take 1 tablet (20 mg total) by mouth at bedtime.     Bp is controlled on the above medication, will continue as prescribed.

## 2023-03-14 NOTE — Assessment & Plan Note (Signed)
I have had an extensive 30 minute conversation today with the patient about healthy eating habits, exercise, calorie and carb goals for sustainable and successful weight loss. I gave the patient caloric and protein daily intake values as well as described the importance of increasing fiber and water intake. I discussed weight loss medications that could be used in the treatment of this patient. Handouts on low carb eating were given to the patient.    I think that she would tolerate taking phentermine for appetite suppression. I discussed the possible side effects of the medication and pt is willing to try it. We will need to monitor her BP carefully at each visit. I have sent in rx's for this and gave the patient calorie, carb, and protein goals. RTC in 3 months

## 2023-03-14 NOTE — Assessment & Plan Note (Signed)
Reviewed last lipid panel.  continue crestor 2.5 mg daily.

## 2023-04-27 ENCOUNTER — Other Ambulatory Visit: Payer: Self-pay | Admitting: Family Medicine

## 2023-04-27 DIAGNOSIS — N3281 Overactive bladder: Secondary | ICD-10-CM

## 2023-06-03 DIAGNOSIS — N6321 Unspecified lump in the left breast, upper outer quadrant: Secondary | ICD-10-CM | POA: Diagnosis not present

## 2023-06-03 LAB — HM MAMMOGRAPHY

## 2023-06-10 ENCOUNTER — Encounter: Payer: Self-pay | Admitting: Family Medicine

## 2023-06-10 ENCOUNTER — Ambulatory Visit (INDEPENDENT_AMBULATORY_CARE_PROVIDER_SITE_OTHER): Payer: Medicare Other | Admitting: Family Medicine

## 2023-06-10 VITALS — BP 141/64 | HR 78 | Temp 97.6°F | Wt 196.4 lb

## 2023-06-10 DIAGNOSIS — E039 Hypothyroidism, unspecified: Secondary | ICD-10-CM | POA: Diagnosis not present

## 2023-06-10 DIAGNOSIS — R252 Cramp and spasm: Secondary | ICD-10-CM | POA: Diagnosis not present

## 2023-06-10 DIAGNOSIS — E785 Hyperlipidemia, unspecified: Secondary | ICD-10-CM

## 2023-06-10 NOTE — Progress Notes (Signed)
 Established Patient Office Visit  Subjective   Patient ID: Samantha Burch, female    DOB: Jan 03, 1956  Age: 68 y.o. MRN: [id number]  Chief Complaint  Patient presents with   Medical Management of Chronic Issues    Pt is here for follow up on her weight today.  She reports that she took the phentermine  but she had to stop taking it due to the severe dry mouth and ankle swelling. Thought that maybe it was a different medication. States that the swelling has only just now started to come down. She was worried that the phentermine  could have caused the swelling I her legs, but she is on other medications as well that could have caused it.   She is also reporting some rather severe muscle cramps in her legs that are waking her from sleep mostly, she denies any changes in her diet or hydration status. She is also reporting some swelling in her legs that started a couple of weeks ago, just now is starting to get better. No other associates symptoms or issues, no chest pain, difficulty breathing or SOB.     Current Outpatient Medications  Medication Instructions   busPIRone  (BUSPAR ) 15 mg, Oral, 2 times daily   Cobalamin Combinations (B-12) 1000-400 MCG SUBL Daily   febuxostat (ULORIC) 40 mg, Daily   fexofenadine  (ALLEGRA ) 180 mg, Oral, Daily   ketoconazole  (NIZORAL ) 2 % shampoo 1 Application, Topical, 2 times weekly   levothyroxine  (SYNTHROID ) 100 mcg, Oral, Daily   omeprazole  (PRILOSEC) 40 mg, Oral, Daily   pregabalin  (LYRICA ) 225 mg, Oral, 2 times daily   rosuvastatin  (CRESTOR ) 2.5 mg, Oral, Daily   sertraline  (ZOLOFT ) 100 MG tablet TAKE 1 AND 1/2 TABLETS DAILY BY MOUTH   telmisartan  (MICARDIS ) 20 mg, Oral, Daily at bedtime   tolterodine  (DETROL ) 2 mg, Oral, 2 times daily   Vitamin D  (Ergocalciferol ) (DRISDOL ) 50,000 Units, Oral, Every 7 days    Patient Active Problem List   Diagnosis Date Noted   Obesity (BMI 30.0-34.9) 03/14/2023   Primary hypertension 04/24/2022    Prediabetes 01/24/2022   Vitamin D  deficiency 04/26/2020   Hyperlipidemia 04/26/2020   B12 deficiency 01/05/2020   Overactive bladder 01/05/2020   Fatty liver 01/05/2020   Gout    Hypothyroidism    GERD (gastroesophageal reflux disease)    Idiopathic peripheral neuropathy 09/19/2016   Depression, recurrent (HCC) 09/19/2016   Anxiety 09/19/2016   Neurogenic bladder 09/19/2016      Review of Systems  All other systems reviewed and are negative.     Objective:     BP (!) 141/64   Pulse 78   Temp 97.6 F (36.4 C)   Wt 196 lb 6.4 oz (89.1 kg)   LMP 05/06/2006 (Approximate)   SpO2 98%   BMI 36.51 kg/m    Physical Exam Vitals reviewed.  Constitutional:      Appearance: Normal appearance. She is obese.  Cardiovascular:     Rate and Rhythm: Normal rate and regular rhythm.     Pulses: Normal pulses.     Heart sounds: Normal heart sounds. No murmur heard. Pulmonary:     Effort: Pulmonary effort is normal.     Breath sounds: Normal breath sounds. No wheezing or rales.  Abdominal:     General: Bowel sounds are normal.  Musculoskeletal:     Right lower leg: No edema.     Left lower leg: Edema (1+ pitting on the left) present.  Neurological:     Mental Status:  She is alert and oriented to person, place, and time. Mental status is at baseline.      The 10-year ASCVD risk score (Arnett DK, et al., 2019) is: 10.9%    Assessment & Plan:  Leg cramps -     Vitamin B12 -     Comprehensive metabolic panel -     Magnesium -     CK  Hyperlipidemia, unspecified hyperlipidemia type Assessment & Plan: Currently on crestor  2.5 mg daily, I'm not sure if the statin is responsible for the legs cramps, will check labs and order new lipid panel today for surveillance.   Orders: -     Lipid panel  Acquired hypothyroidism Assessment & Plan: Needs new TSH, continue her levothyroxine  100 mcg daily.   Orders: -     TSH  I spent 30 minutes discussing her leg cramps,  counseling her on continued dietary control for weight loss, will not start any other medication for weight loss at this time.   Return in about 6 months (around 12/08/2023) for HTN.    Heron CHRISTELLA Sharper, MD

## 2023-06-11 ENCOUNTER — Encounter: Payer: Self-pay | Admitting: Family Medicine

## 2023-06-11 LAB — LIPID PANEL
Cholesterol: 190 mg/dL (ref 0–200)
HDL: 53.2 mg/dL (ref >39.00)
LDL Cholesterol: 105 mg/dL — ABNORMAL HIGH (ref 0–99)
NonHDL: 136.41
Total CHOL/HDL Ratio: 4
Triglycerides: 156 mg/dL — ABNORMAL HIGH (ref 0.0–149.0)
VLDL: 31.2 mg/dL (ref 0.0–40.0)

## 2023-06-11 LAB — COMPREHENSIVE METABOLIC PANEL
ALT: 20 U/L (ref 0–35)
AST: 28 U/L (ref 0–37)
Albumin: 4.2 g/dL (ref 3.5–5.2)
Alkaline Phosphatase: 72 U/L (ref 39–117)
BUN: 9 mg/dL (ref 6–23)
CO2: 26 meq/L (ref 19–32)
Calcium: 9.2 mg/dL (ref 8.4–10.5)
Chloride: 102 meq/L (ref 96–112)
Creatinine, Ser: 0.78 mg/dL (ref 0.40–1.20)
GFR: 78.83 mL/min (ref >60.00)
Glucose, Bld: 88 mg/dL (ref 70–99)
Potassium: 3.7 meq/L (ref 3.5–5.1)
Sodium: 139 meq/L (ref 135–145)
Total Bilirubin: 0.5 mg/dL (ref 0.2–1.2)
Total Protein: 6.9 g/dL (ref 6.0–8.3)

## 2023-06-11 LAB — MAGNESIUM: Magnesium: 1.3 mg/dL — ABNORMAL LOW (ref 1.5–2.5)

## 2023-06-11 LAB — TSH: TSH: 0.86 u[IU]/mL (ref 0.35–5.50)

## 2023-06-11 LAB — VITAMIN B12: Vitamin B-12: 869 pg/mL (ref 211–911)

## 2023-06-11 LAB — CK: Total CK: 126 U/L (ref 7–177)

## 2023-06-12 NOTE — Assessment & Plan Note (Signed)
 Needs new TSH, continue her levothyroxine  100 mcg daily.

## 2023-06-12 NOTE — Assessment & Plan Note (Signed)
 Currently on crestor  2.5 mg daily, I'm not sure if the statin is responsible for the legs cramps, will check labs and order new lipid panel today for surveillance.

## 2023-06-20 ENCOUNTER — Encounter: Payer: Self-pay | Admitting: Family Medicine

## 2023-07-22 DIAGNOSIS — Z23 Encounter for immunization: Secondary | ICD-10-CM | POA: Diagnosis not present

## 2023-07-22 DIAGNOSIS — M1A09X0 Idiopathic chronic gout, multiple sites, without tophus (tophi): Secondary | ICD-10-CM | POA: Diagnosis not present

## 2023-10-01 ENCOUNTER — Other Ambulatory Visit: Payer: Self-pay | Admitting: Neurology

## 2023-10-01 DIAGNOSIS — G609 Hereditary and idiopathic neuropathy, unspecified: Secondary | ICD-10-CM

## 2023-10-17 ENCOUNTER — Other Ambulatory Visit: Payer: Self-pay | Admitting: Family Medicine

## 2023-10-17 DIAGNOSIS — K219 Gastro-esophageal reflux disease without esophagitis: Secondary | ICD-10-CM

## 2023-10-17 DIAGNOSIS — I10 Essential (primary) hypertension: Secondary | ICD-10-CM

## 2023-10-18 ENCOUNTER — Other Ambulatory Visit: Payer: Self-pay | Admitting: Family Medicine

## 2023-10-18 DIAGNOSIS — E782 Mixed hyperlipidemia: Secondary | ICD-10-CM

## 2023-11-07 ENCOUNTER — Other Ambulatory Visit: Payer: Self-pay | Admitting: Family Medicine

## 2023-11-07 DIAGNOSIS — E039 Hypothyroidism, unspecified: Secondary | ICD-10-CM

## 2023-11-27 ENCOUNTER — Other Ambulatory Visit: Payer: Self-pay | Admitting: Family Medicine

## 2023-11-27 DIAGNOSIS — F32A Depression, unspecified: Secondary | ICD-10-CM

## 2023-11-27 DIAGNOSIS — F419 Anxiety disorder, unspecified: Secondary | ICD-10-CM

## 2023-12-09 ENCOUNTER — Ambulatory Visit (INDEPENDENT_AMBULATORY_CARE_PROVIDER_SITE_OTHER): Payer: Medicare Other | Admitting: Family Medicine

## 2023-12-09 ENCOUNTER — Encounter: Payer: Self-pay | Admitting: Family Medicine

## 2023-12-09 VITALS — BP 124/68 | HR 61 | Temp 97.9°F | Ht 61.5 in | Wt 201.4 lb

## 2023-12-09 DIAGNOSIS — I10 Essential (primary) hypertension: Secondary | ICD-10-CM | POA: Diagnosis not present

## 2023-12-09 DIAGNOSIS — R7303 Prediabetes: Secondary | ICD-10-CM | POA: Diagnosis not present

## 2023-12-09 DIAGNOSIS — L219 Seborrheic dermatitis, unspecified: Secondary | ICD-10-CM | POA: Diagnosis not present

## 2023-12-09 LAB — POCT GLYCOSYLATED HEMOGLOBIN (HGB A1C): Hemoglobin A1C: 6 % — AB (ref 4.0–5.6)

## 2023-12-09 MED ORDER — KETOCONAZOLE 2 % EX SHAM
1.0000 | MEDICATED_SHAMPOO | CUTANEOUS | 5 refills | Status: AC
Start: 2023-12-11 — End: ?

## 2023-12-09 NOTE — Progress Notes (Unsigned)
 Established Patient Office Visit  Subjective   Patient ID: Samantha Burch, female    DOB: 02-18-1956  Age: 68 y.o. MRN: [id number]  Chief Complaint  Patient presents with   Medical Management of Chronic Issues    Pt is here for follow up on her HTN and prediabetes. She reports she is feeling well, states her muscle cramps are slightly better. I reviewed her labs from February and she had a low magnesium. States she was going to start the magnesium supplements but she got confused about which one she needed.  HTN -- BP in office performed and is well controlled. She  reports no side effects to the medications, no chest pain, SOB, dizziness or headaches. She has a BP cuff at home and is checking BP regularly, reports they are in the normal range.   Prediabetes-- pt reports she noticed a few pounds of weight gain since her last appointment, however A1C in the office today is stable from previous. States she is trying to work on losing weight but it is hard to do. States she doesn't get a lot of physical activity due to chronic back pain, states if she has to do anything with her arms it is difficult. States she also has transportation issues, has to share a car with her son  and she cannot always make it to outpatient PT. We discussed the website [email or web address] and I gave her phone numbers to call to see what transportation is available to her.     Current Outpatient Medications  Medication Instructions   busPIRone  (BUSPAR ) 15 mg, Oral, 2 times daily   Cobalamin Combinations (B-12) 1000-400 MCG SUBL Daily   febuxostat (ULORIC) 40 mg, Daily   fexofenadine  (ALLEGRA ) 180 mg, Oral, Daily   [START ON 12/11/2023] ketoconazole  (NIZORAL ) 2 % shampoo 1 Application, Topical, 2 times weekly   levothyroxine  (SYNTHROID ) 100 mcg, Oral, Daily   omeprazole  (PRILOSEC) 40 mg, Oral, Daily   pregabalin  (LYRICA ) 225 mg, Oral, 2 times daily   rosuvastatin  (CRESTOR ) 2.5 mg, Oral, Daily   sertraline  (ZOLOFT )  150 mg, Oral, Daily   telmisartan  (MICARDIS ) 20 MG tablet TAKE 1 TABLET BY MOUTH EVERYDAY AT BEDTIME   tolterodine  (DETROL ) 2 mg, Oral, 2 times daily    {History (Optional):23778}  Review of Systems  All other systems reviewed and are negative.     Objective:     BP 124/68   Pulse 61   Temp 97.9 F (36.6 C) (Oral)   Ht 5' 1.5 (1.562 m)   Wt 201 lb 6.4 oz (91.4 kg)   LMP 05/06/2006 (Approximate)   SpO2 96%   BMI 37.44 kg/m  {Vitals History (Optional):23777}  Physical Exam   Results for orders placed or performed in visit on 12/09/23  POC HgB A1c  Result Value Ref Range   Hemoglobin A1C 6.0 (A) 4.0 - 5.6 %   HbA1c POC (<> result, manual entry)     HbA1c, POC (prediabetic range)     HbA1c, POC (controlled diabetic range)      {Labs (Optional):23779}  The 10-year ASCVD risk score (Arnett DK, et al., 2019) is: 8.6%    Assessment & Plan:  Prediabetes -     POCT glycosylated hemoglobin (Hb A1C) -     Collection capillary blood specimen  Seborrheic dermatitis of scalp -     Ketoconazole ; Apply 1 Application topically 2 (two) times a week.  Dispense: 120 mL; Refill: 5     Return for AWV -- phoine  visit with Ro[facility] anytime is fine.    Heron CHRISTELLA Sharper, MD

## 2023-12-09 NOTE — Patient Instructions (Addendum)
 Magnesium oxide -- 400 mg every night before bed.  Phone number for transportation services: Call [phone] for the health department or  [phone] for the [facility] transportation service.

## 2023-12-10 NOTE — Assessment & Plan Note (Signed)
 A1C is 6.0, stable from previous, will continue to monitor every 6 months, currently not on medications, is dietary controlled. I spent 30 minutes with patient today addressing transportation issues and showing her the [email or web address] website.

## 2023-12-10 NOTE — Assessment & Plan Note (Signed)
 Current hypertension medications:       Sig   telmisartan  (MICARDIS ) 20 MG tablet (Taking) TAKE 1 TABLET BY MOUTH EVERYDAY AT BEDTIME     Bp is controlled on the above medication, will continue as prescribed.

## 2024-01-03 ENCOUNTER — Other Ambulatory Visit: Payer: Self-pay | Admitting: Family Medicine

## 2024-01-03 DIAGNOSIS — K219 Gastro-esophageal reflux disease without esophagitis: Secondary | ICD-10-CM

## 2024-01-03 DIAGNOSIS — I10 Essential (primary) hypertension: Secondary | ICD-10-CM

## 2024-01-14 DIAGNOSIS — Z6837 Body mass index (BMI) 37.0-37.9, adult: Secondary | ICD-10-CM | POA: Diagnosis not present

## 2024-01-14 DIAGNOSIS — E669 Obesity, unspecified: Secondary | ICD-10-CM | POA: Diagnosis not present

## 2024-01-14 DIAGNOSIS — M1A09X0 Idiopathic chronic gout, multiple sites, without tophus (tophi): Secondary | ICD-10-CM | POA: Diagnosis not present

## 2024-01-14 DIAGNOSIS — Z79899 Other long term (current) drug therapy: Secondary | ICD-10-CM | POA: Diagnosis not present

## 2024-01-17 ENCOUNTER — Other Ambulatory Visit: Payer: Self-pay | Admitting: Family Medicine

## 2024-01-17 DIAGNOSIS — N3281 Overactive bladder: Secondary | ICD-10-CM

## 2024-02-09 ENCOUNTER — Ambulatory Visit (INDEPENDENT_AMBULATORY_CARE_PROVIDER_SITE_OTHER): Payer: Medicare Other | Admitting: Neurology

## 2024-02-09 ENCOUNTER — Encounter: Payer: Self-pay | Admitting: Neurology

## 2024-02-09 VITALS — BP 109/70 | HR 74 | Ht 61.5 in | Wt 199.0 lb

## 2024-02-09 DIAGNOSIS — G609 Hereditary and idiopathic neuropathy, unspecified: Secondary | ICD-10-CM

## 2024-02-09 MED ORDER — PREGABALIN 200 MG PO CAPS: 200.0000 mg | ORAL_CAPSULE | Freq: Two times a day (BID) | ORAL | 4 refills | Status: AC

## 2024-02-09 NOTE — Patient Instructions (Addendum)
 It's always good to see you. Let's reduce Lyrica  (Pregabalin ) to 200mg : take 1 capsule twice a day. Let me know if any issues on lower dose. Proceed with PT when able. Follow-up in 1 year, call for any changes.

## 2024-02-09 NOTE — Progress Notes (Signed)
 NEUROLOGY FOLLOW UP OFFICE NOTE  Samantha Burch [id number] October 04, 1955  HISTORY OF PRESENT ILLNESS: I had the pleasure of seeing Samantha Burch in follow-up in the neurology clinic on 02/09/2024.  The patient was last seen a year ago for peripheral polyneuropathy. She is alone in the office today. Records and images were personally reviewed where available.  Since her last visit, she reports neuropathy has been stable. She is still wobbly, her legs occasionally feel restless, however there are no significant paresthesias. She has been on Lyrica  225mg  BID for many years without side effects and wonders about potentially reducing dose as tolerated. She denies any falls. No headaches, dizziness, bowel dysfunction. She has back pain and that worsens when she has to do anything with her arms, even washing her hair. She also has some pain in her right hip radiating down the leg. She has urinary incontinence and takes Detrol .    History on Initial Assessment 09/19/2016: This is a very pleasant 68 year old right-handed woman with a history of hypothyroidism, neurogenic bladder, depression, anxiety, presenting to establish care for peripheral polyneuropathy. She recently moved to [facility] from [place]  with her son. She had been seeing neurologist Dr. Waylan for the neuropathy. 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 Symptoms worsened over time with significant pain in both legs described as pain inside her legs like she wanted to scratch them all the time. She described pins and needles sensation, sometimes affecting her arms as well. She was initially started on gabapentin but it stopped working, and has been taking Lyrica  for the past 10 years. She is taking Lyrica  225mg  BID without side effects. She reports symptom control is much better but not perfect, there are days the neuropathy bothers her, more when she is inactive. She  has found that moving around helps relieve the symptoms. She fell last year and broke her left foot, and since then has tended to veer when she gets up. She denies any back pain, but reports pain in the right hip region radiating down the back of her right leg. She tells me her neurologist told her she has unusually brisk reflexes for the neuropathy, per records she has had an MRI brain, cervical, thoracic spine that were unremarkable. NCS reported as normal. Her B12 level was low and she has been on monthly B12 injections. She has a history of vertigo that significantly improved with vestibular therapy, she was told she has gaze instability, and has intermittent vertigo with positional changes. She denies any headaches, tinnitus, diplopia, dysarthria/dysphagia, neck pain, bowel/bladder dysfunction. She has a history of neurogenic bladder on Detrol , low potassium on daily potassium supplements, and depression/anxiety on Zoloft . She reports a family history of peripheral polyneuropathy in her father and paternal grandfather.  PAST MEDICAL HISTORY: Past Medical History:  Diagnosis Date   Anxiety    Depression    GERD (gastroesophageal reflux disease)    Gout    Hyperlipidemia    Hypertension    Hypoglycemia    Hypothyroidism    Neurogenic bladder    Neuropathy    Urinary incontinence     MEDICATIONS: Current Outpatient Medications on File Prior to Visit  Medication Sig Dispense Refill   busPIRone  (BUSPAR ) 15 MG tablet TAKE 1 TABLET BY MOUTH 2 TIMES DAILY. 180 tablet 1   Cobalamin Combinations (B-12) 1000-400 MCG SUBL Place under the tongue daily.  febuxostat (ULORIC) 40 MG tablet Take 40 mg by mouth daily.     fexofenadine  (ALLEGRA ) 180 MG tablet Take 1 tablet (180 mg total) by mouth daily. 90 tablet 1   ketoconazole  (NIZORAL ) 2 % shampoo Apply 1 Application topically 2 (two) times a week. 120 mL 5   levothyroxine  (SYNTHROID ) 100 MCG tablet TAKE 1 TABLET BY MOUTH EVERY DAY 90 tablet 1    omeprazole  (PRILOSEC) 40 MG capsule TAKE 1 CAPSULE (40 MG TOTAL) BY MOUTH DAILY. 90 capsule 0   pregabalin  (LYRICA ) 225 MG capsule TAKE 1 CAPSULE BY MOUTH 2 TIMES DAILY. 180 capsule 3   rosuvastatin  (CRESTOR ) 5 MG tablet TAKE 1/2 TABLET BY MOUTH DAILY 45 tablet 1   sertraline  (ZOLOFT ) 100 MG tablet TAKE 1 AND 1/2 TABLETS BY MOUTH DAILY 135 tablet 1   telmisartan  (MICARDIS ) 20 MG tablet TAKE 1 TABLET BY MOUTH EVERYDAY AT BEDTIME 90 tablet 0   tolterodine  (DETROL ) 2 MG tablet TAKE 1 TABLET BY MOUTH 2 TIMES DAILY. 180 tablet 1   No current facility-administered medications on file prior to visit.    ALLERGIES: Allergies  Allergen Reactions   Elemental Sulfur    Lipitor [Atorvastatin]     Cramping muscles   Pravastatin      cramping   Sulfa Antibiotics     FAMILY HISTORY: Family History  Problem Relation Age of Onset   Ovarian cancer Mother    Alcohol abuse Father    Hypertension Father    Diabetes type II Father    Neuropathy Father    Neuropathy Paternal Grandmother    Colon cancer Maternal Uncle     SOCIAL HISTORY: Social History   Socioeconomic History   Marital status: Divorced    Spouse name: Not on file   Number of children: Not on file   Years of education: Not on file   Highest education level: Not on file  Occupational History   Occupation: Disabled  Tobacco Use   Smoking status: Former    Current packs/day: 0.00    Average packs/day: 1 pack/day for 30.0 years (30.0 ttl pk-yrs)    Types: Cigarettes    Start date: 05/06/1981    Quit date: 05/07/2011    Years since quitting: 12.7   Smokeless tobacco: Never  Vaping Use   Vaping status: Never Used  Substance and Sexual Activity   Alcohol use: No   Drug use: No   Sexual activity: Never  Other Topics Concern   Not on file  Social History Narrative   Right handed      Highest level of edu- 12th grade      Son lives with pt ( age 56)      Social Drivers of Corporate investment banker Strain: Low Risk   (01/24/2022)   Overall Financial Resource Strain (CARDIA)    Difficulty of Paying Living Expenses: Not hard at all  Food Insecurity: No Food Insecurity (01/24/2022)   Hunger Vital Sign    Worried About Running Out of Food in the Last Year: Never true    Ran Out of Food in the Last Year: Never true  Transportation Needs: No Transportation Needs (01/24/2022)   PRAPARE - Administrator, Civil Service (Medical): No    Lack of Transportation (Non-Medical): No  Physical Activity: Inactive (01/24/2022)   Exercise Vital Sign    Days of Exercise per Week: 0 days    Minutes of Exercise per Session: 0 min  Stress: No Stress Concern Present (  01/24/2022)   [employer] of Occupational Health - Occupational Stress Questionnaire    Feeling of Stress : Not at all  Social Connections: Socially Isolated (01/24/2022)   Social Connection and Isolation Panel    Frequency of Communication with Friends and Family: More than three times a week    Frequency of Social Gatherings with Friends and Family: More than three times a week    Attends Religious Services: Never    Database administrator or Organizations: No    Attends Banker Meetings: Never    Marital Status: Divorced  Catering manager Violence: Not At Risk (01/24/2022)   Humiliation, Afraid, Rape, and Kick questionnaire    Fear of Current or Ex-Partner: No    Emotionally Abused: No    Physically Abused: No    Sexually Abused: No     PHYSICAL EXAM: Vitals:   02/09/24 1406  BP: 109/70  Pulse: 74  SpO2: 96%   General: No acute distress Head:  Normocephalic/atraumatic Skin/Extremities: No rash, no edema Neurological Exam: alert and awake. No aphasia or dysarthria. Fund of knowledge is appropriate. Attention and concentration are normal.   Cranial nerves: Pupils equal, round. Extraocular movements intact with no nystagmus. Visual fields full.  No facial asymmetry.  Motor: Bulk and tone normal, muscle strength 5/5  throughout with no pronator drift. Reflexes brisk +3 on both UE with hyperactive pectoralis reflex on right, negative Hoffman sign. +2 both LE.  Finger to nose testing intact.  Gait narrow-based and steady, no ataxia. Slight sway with Romberg test   IMPRESSION: This is a very pleasant 68 yo RH woman with a history of hypothyroidism, neurogenic bladder, depression, anxiety, with peripheral polyneuropathy. Neuropathy stable, she is interested in reducing Lyrica  dose to least amount possible. We will reduce to 200mg  BID, she will let us  know if no issues, we can reduce further to 150mg  BID as tolerated. Proceed with PT for back pain when able. Follow-up in 1 year, call for any changes.    Thank you for allowing me to participate in her care.  Please do not hesitate to call for any questions or concerns.    Darice Shivers, M.D.   CC: Dr. Ozell

## 2024-03-23 ENCOUNTER — Encounter: Payer: Self-pay | Admitting: Neurology

## 2024-03-23 DIAGNOSIS — Z23 Encounter for immunization: Secondary | ICD-10-CM | POA: Diagnosis not present

## 2024-04-07 ENCOUNTER — Other Ambulatory Visit: Payer: Self-pay | Admitting: Family Medicine

## 2024-04-07 DIAGNOSIS — K219 Gastro-esophageal reflux disease without esophagitis: Secondary | ICD-10-CM

## 2024-04-07 DIAGNOSIS — I10 Essential (primary) hypertension: Secondary | ICD-10-CM

## 2024-04-11 ENCOUNTER — Other Ambulatory Visit: Payer: Self-pay | Admitting: Family Medicine

## 2024-04-11 DIAGNOSIS — E782 Mixed hyperlipidemia: Secondary | ICD-10-CM

## 2024-05-07 ENCOUNTER — Encounter

## 2024-05-09 ENCOUNTER — Other Ambulatory Visit: Payer: Self-pay | Admitting: Family Medicine

## 2024-05-09 DIAGNOSIS — E039 Hypothyroidism, unspecified: Secondary | ICD-10-CM

## 2024-05-30 ENCOUNTER — Other Ambulatory Visit: Payer: Self-pay | Admitting: Family Medicine

## 2024-05-30 DIAGNOSIS — N3281 Overactive bladder: Secondary | ICD-10-CM

## 2024-06-08 ENCOUNTER — Ambulatory Visit: Admitting: Family Medicine

## 2024-06-11 ENCOUNTER — Ambulatory Visit: Admitting: Family Medicine

## 2025-02-08 ENCOUNTER — Ambulatory Visit: Admitting: Neurology
